# Patient Record
Sex: Female | Born: 1980
Health system: Southern US, Community
[De-identification: ages and names within clinical notes are randomized; demographics above are authoritative.]

## PROBLEM LIST (undated history)

## (undated) DIAGNOSIS — I071 Rheumatic tricuspid insufficiency: Secondary | ICD-10-CM

## (undated) DIAGNOSIS — E785 Hyperlipidemia, unspecified: Secondary | ICD-10-CM

## (undated) DIAGNOSIS — I471 Supraventricular tachycardia, unspecified: Secondary | ICD-10-CM

## (undated) DIAGNOSIS — F419 Anxiety disorder, unspecified: Secondary | ICD-10-CM

## (undated) DIAGNOSIS — K219 Gastro-esophageal reflux disease without esophagitis: Secondary | ICD-10-CM

## (undated) DIAGNOSIS — F32A Depression, unspecified: Secondary | ICD-10-CM

## (undated) DIAGNOSIS — F319 Bipolar disorder, unspecified: Secondary | ICD-10-CM

## (undated) DIAGNOSIS — E559 Vitamin D deficiency, unspecified: Secondary | ICD-10-CM

## (undated) DIAGNOSIS — I499 Cardiac arrhythmia, unspecified: Secondary | ICD-10-CM

## (undated) DIAGNOSIS — E0590 Thyrotoxicosis, unspecified without thyrotoxic crisis or storm: Secondary | ICD-10-CM

## (undated) DIAGNOSIS — D72829 Elevated white blood cell count, unspecified: Secondary | ICD-10-CM

## (undated) HISTORY — PX: KNEE ARTHROSCOPY: SUR90

## (undated) HISTORY — DX: Rheumatic tricuspid insufficiency: I07.1

## (undated) HISTORY — DX: Supraventricular tachycardia, unspecified: I47.10

## (undated) HISTORY — DX: Hyperlipidemia, unspecified: E78.5

## (undated) HISTORY — DX: Vitamin D deficiency, unspecified: E55.9

## (undated) HISTORY — PX: WRIST SURGERY: SHX841

## (undated) HISTORY — PX: ABDOMINAL HYSTERECTOMY: SHX81

## (undated) HISTORY — DX: Thyrotoxicosis, unspecified without thyrotoxic crisis or storm: E05.90

## (undated) HISTORY — DX: Elevated white blood cell count, unspecified: D72.829

---

## 2002-01-26 HISTORY — PX: ABLATION: SHX5711

## 2006-07-27 HISTORY — PX: ABDOMINAL HYSTERECTOMY: SHX81

## 2011-02-23 DIAGNOSIS — J019 Acute sinusitis, unspecified: Secondary | ICD-10-CM | POA: Diagnosis not present

## 2011-02-23 DIAGNOSIS — R350 Frequency of micturition: Secondary | ICD-10-CM | POA: Diagnosis not present

## 2011-03-30 DIAGNOSIS — J069 Acute upper respiratory infection, unspecified: Secondary | ICD-10-CM | POA: Diagnosis not present

## 2011-03-30 DIAGNOSIS — R509 Fever, unspecified: Secondary | ICD-10-CM | POA: Diagnosis not present

## 2011-03-30 DIAGNOSIS — E0590 Thyrotoxicosis, unspecified without thyrotoxic crisis or storm: Secondary | ICD-10-CM | POA: Diagnosis not present

## 2011-03-30 DIAGNOSIS — F17200 Nicotine dependence, unspecified, uncomplicated: Secondary | ICD-10-CM | POA: Diagnosis not present

## 2011-03-30 DIAGNOSIS — R05 Cough: Secondary | ICD-10-CM | POA: Diagnosis not present

## 2011-03-31 DIAGNOSIS — E0590 Thyrotoxicosis, unspecified without thyrotoxic crisis or storm: Secondary | ICD-10-CM | POA: Diagnosis not present

## 2011-03-31 DIAGNOSIS — K219 Gastro-esophageal reflux disease without esophagitis: Secondary | ICD-10-CM | POA: Diagnosis not present

## 2011-04-01 DIAGNOSIS — R7401 Elevation of levels of liver transaminase levels: Secondary | ICD-10-CM | POA: Diagnosis not present

## 2011-04-01 DIAGNOSIS — R1011 Right upper quadrant pain: Secondary | ICD-10-CM | POA: Diagnosis not present

## 2011-04-01 DIAGNOSIS — R1013 Epigastric pain: Secondary | ICD-10-CM | POA: Diagnosis not present

## 2011-04-01 DIAGNOSIS — R7989 Other specified abnormal findings of blood chemistry: Secondary | ICD-10-CM | POA: Diagnosis not present

## 2011-04-27 DIAGNOSIS — N760 Acute vaginitis: Secondary | ICD-10-CM | POA: Diagnosis not present

## 2011-04-27 DIAGNOSIS — F17200 Nicotine dependence, unspecified, uncomplicated: Secondary | ICD-10-CM | POA: Diagnosis not present

## 2011-04-27 DIAGNOSIS — A499 Bacterial infection, unspecified: Secondary | ICD-10-CM | POA: Diagnosis not present

## 2011-04-27 DIAGNOSIS — N949 Unspecified condition associated with female genital organs and menstrual cycle: Secondary | ICD-10-CM | POA: Diagnosis not present

## 2011-04-27 DIAGNOSIS — R109 Unspecified abdominal pain: Secondary | ICD-10-CM | POA: Diagnosis not present

## 2011-06-22 DIAGNOSIS — L02519 Cutaneous abscess of unspecified hand: Secondary | ICD-10-CM | POA: Diagnosis not present

## 2011-06-22 DIAGNOSIS — E0590 Thyrotoxicosis, unspecified without thyrotoxic crisis or storm: Secondary | ICD-10-CM | POA: Diagnosis not present

## 2011-06-22 DIAGNOSIS — E7800 Pure hypercholesterolemia, unspecified: Secondary | ICD-10-CM | POA: Diagnosis not present

## 2011-06-22 DIAGNOSIS — F17200 Nicotine dependence, unspecified, uncomplicated: Secondary | ICD-10-CM | POA: Diagnosis not present

## 2011-08-02 DIAGNOSIS — F17200 Nicotine dependence, unspecified, uncomplicated: Secondary | ICD-10-CM | POA: Diagnosis not present

## 2011-08-02 DIAGNOSIS — Z23 Encounter for immunization: Secondary | ICD-10-CM | POA: Diagnosis not present

## 2011-08-02 DIAGNOSIS — E0590 Thyrotoxicosis, unspecified without thyrotoxic crisis or storm: Secondary | ICD-10-CM | POA: Diagnosis not present

## 2011-08-02 DIAGNOSIS — T310 Burns involving less than 10% of body surface: Secondary | ICD-10-CM | POA: Diagnosis not present

## 2011-08-02 DIAGNOSIS — IMO0002 Reserved for concepts with insufficient information to code with codable children: Secondary | ICD-10-CM | POA: Diagnosis not present

## 2011-08-02 DIAGNOSIS — T23209A Burn of second degree of unspecified hand, unspecified site, initial encounter: Secondary | ICD-10-CM | POA: Diagnosis not present

## 2011-08-02 DIAGNOSIS — E7800 Pure hypercholesterolemia, unspecified: Secondary | ICD-10-CM | POA: Diagnosis not present

## 2011-09-08 DIAGNOSIS — N951 Menopausal and female climacteric states: Secondary | ICD-10-CM | POA: Diagnosis not present

## 2011-09-08 DIAGNOSIS — N760 Acute vaginitis: Secondary | ICD-10-CM | POA: Diagnosis not present

## 2011-09-08 DIAGNOSIS — E8941 Symptomatic postprocedural ovarian failure: Secondary | ICD-10-CM | POA: Diagnosis not present

## 2011-09-09 DIAGNOSIS — K029 Dental caries, unspecified: Secondary | ICD-10-CM | POA: Diagnosis not present

## 2011-09-09 DIAGNOSIS — E0590 Thyrotoxicosis, unspecified without thyrotoxic crisis or storm: Secondary | ICD-10-CM | POA: Diagnosis not present

## 2011-09-09 DIAGNOSIS — K047 Periapical abscess without sinus: Secondary | ICD-10-CM | POA: Diagnosis not present

## 2011-09-09 DIAGNOSIS — E7800 Pure hypercholesterolemia, unspecified: Secondary | ICD-10-CM | POA: Diagnosis not present

## 2011-09-09 DIAGNOSIS — F17200 Nicotine dependence, unspecified, uncomplicated: Secondary | ICD-10-CM | POA: Diagnosis not present

## 2011-09-16 DIAGNOSIS — Z113 Encounter for screening for infections with a predominantly sexual mode of transmission: Secondary | ICD-10-CM | POA: Diagnosis not present

## 2011-09-16 DIAGNOSIS — N760 Acute vaginitis: Secondary | ICD-10-CM | POA: Diagnosis not present

## 2011-10-27 DIAGNOSIS — F3162 Bipolar disorder, current episode mixed, moderate: Secondary | ICD-10-CM | POA: Diagnosis not present

## 2011-10-27 DIAGNOSIS — M549 Dorsalgia, unspecified: Secondary | ICD-10-CM | POA: Diagnosis not present

## 2011-10-27 DIAGNOSIS — R748 Abnormal levels of other serum enzymes: Secondary | ICD-10-CM | POA: Diagnosis not present

## 2011-10-27 DIAGNOSIS — M546 Pain in thoracic spine: Secondary | ICD-10-CM | POA: Diagnosis not present

## 2011-12-04 DIAGNOSIS — R509 Fever, unspecified: Secondary | ICD-10-CM | POA: Diagnosis not present

## 2011-12-04 DIAGNOSIS — R109 Unspecified abdominal pain: Secondary | ICD-10-CM | POA: Diagnosis not present

## 2011-12-04 DIAGNOSIS — R112 Nausea with vomiting, unspecified: Secondary | ICD-10-CM | POA: Diagnosis not present

## 2011-12-04 DIAGNOSIS — K5289 Other specified noninfective gastroenteritis and colitis: Secondary | ICD-10-CM | POA: Diagnosis not present

## 2012-01-26 DIAGNOSIS — E0590 Thyrotoxicosis, unspecified without thyrotoxic crisis or storm: Secondary | ICD-10-CM | POA: Diagnosis not present

## 2012-01-26 DIAGNOSIS — E039 Hypothyroidism, unspecified: Secondary | ICD-10-CM | POA: Diagnosis not present

## 2012-01-26 DIAGNOSIS — E782 Mixed hyperlipidemia: Secondary | ICD-10-CM | POA: Diagnosis not present

## 2012-01-26 DIAGNOSIS — E119 Type 2 diabetes mellitus without complications: Secondary | ICD-10-CM | POA: Diagnosis not present

## 2012-01-26 DIAGNOSIS — F341 Dysthymic disorder: Secondary | ICD-10-CM | POA: Diagnosis not present

## 2012-01-26 DIAGNOSIS — I10 Essential (primary) hypertension: Secondary | ICD-10-CM | POA: Diagnosis not present

## 2012-01-26 DIAGNOSIS — F3162 Bipolar disorder, current episode mixed, moderate: Secondary | ICD-10-CM | POA: Diagnosis not present

## 2012-01-28 DIAGNOSIS — E7800 Pure hypercholesterolemia, unspecified: Secondary | ICD-10-CM | POA: Diagnosis not present

## 2012-01-28 DIAGNOSIS — R109 Unspecified abdominal pain: Secondary | ICD-10-CM | POA: Diagnosis not present

## 2012-01-28 DIAGNOSIS — F17200 Nicotine dependence, unspecified, uncomplicated: Secondary | ICD-10-CM | POA: Diagnosis not present

## 2012-01-28 DIAGNOSIS — E0590 Thyrotoxicosis, unspecified without thyrotoxic crisis or storm: Secondary | ICD-10-CM | POA: Diagnosis not present

## 2012-03-08 DIAGNOSIS — F17200 Nicotine dependence, unspecified, uncomplicated: Secondary | ICD-10-CM | POA: Diagnosis not present

## 2012-03-08 DIAGNOSIS — J209 Acute bronchitis, unspecified: Secondary | ICD-10-CM | POA: Diagnosis not present

## 2012-03-08 DIAGNOSIS — F3160 Bipolar disorder, current episode mixed, unspecified: Secondary | ICD-10-CM | POA: Diagnosis not present

## 2012-03-24 DIAGNOSIS — F319 Bipolar disorder, unspecified: Secondary | ICD-10-CM | POA: Diagnosis not present

## 2012-04-07 DIAGNOSIS — F3160 Bipolar disorder, current episode mixed, unspecified: Secondary | ICD-10-CM | POA: Diagnosis not present

## 2012-05-09 DIAGNOSIS — F3160 Bipolar disorder, current episode mixed, unspecified: Secondary | ICD-10-CM | POA: Diagnosis not present

## 2012-08-03 DIAGNOSIS — E782 Mixed hyperlipidemia: Secondary | ICD-10-CM | POA: Diagnosis not present

## 2012-08-03 DIAGNOSIS — E0590 Thyrotoxicosis, unspecified without thyrotoxic crisis or storm: Secondary | ICD-10-CM | POA: Diagnosis not present

## 2012-08-04 DIAGNOSIS — M79609 Pain in unspecified limb: Secondary | ICD-10-CM | POA: Diagnosis not present

## 2012-08-04 DIAGNOSIS — F17200 Nicotine dependence, unspecified, uncomplicated: Secondary | ICD-10-CM | POA: Diagnosis not present

## 2012-08-04 DIAGNOSIS — M25549 Pain in joints of unspecified hand: Secondary | ICD-10-CM | POA: Diagnosis not present

## 2012-08-04 DIAGNOSIS — J45909 Unspecified asthma, uncomplicated: Secondary | ICD-10-CM | POA: Diagnosis not present

## 2012-08-04 DIAGNOSIS — E7800 Pure hypercholesterolemia, unspecified: Secondary | ICD-10-CM | POA: Diagnosis not present

## 2012-08-04 DIAGNOSIS — E0590 Thyrotoxicosis, unspecified without thyrotoxic crisis or storm: Secondary | ICD-10-CM | POA: Diagnosis not present

## 2012-08-10 DIAGNOSIS — F3160 Bipolar disorder, current episode mixed, unspecified: Secondary | ICD-10-CM | POA: Diagnosis not present

## 2012-08-10 DIAGNOSIS — F17200 Nicotine dependence, unspecified, uncomplicated: Secondary | ICD-10-CM | POA: Diagnosis not present

## 2012-09-19 DIAGNOSIS — T7840XA Allergy, unspecified, initial encounter: Secondary | ICD-10-CM | POA: Diagnosis not present

## 2012-09-19 DIAGNOSIS — H5210 Myopia, unspecified eye: Secondary | ICD-10-CM | POA: Diagnosis not present

## 2012-09-19 DIAGNOSIS — H52229 Regular astigmatism, unspecified eye: Secondary | ICD-10-CM | POA: Diagnosis not present

## 2012-10-03 DIAGNOSIS — H1044 Vernal conjunctivitis: Secondary | ICD-10-CM | POA: Diagnosis not present

## 2012-11-08 DIAGNOSIS — Z1389 Encounter for screening for other disorder: Secondary | ICD-10-CM | POA: Diagnosis not present

## 2012-11-08 DIAGNOSIS — F17200 Nicotine dependence, unspecified, uncomplicated: Secondary | ICD-10-CM | POA: Diagnosis not present

## 2012-11-08 DIAGNOSIS — IMO0002 Reserved for concepts with insufficient information to code with codable children: Secondary | ICD-10-CM | POA: Diagnosis not present

## 2012-11-08 DIAGNOSIS — J029 Acute pharyngitis, unspecified: Secondary | ICD-10-CM | POA: Diagnosis not present

## 2012-11-22 DIAGNOSIS — F17200 Nicotine dependence, unspecified, uncomplicated: Secondary | ICD-10-CM | POA: Diagnosis not present

## 2012-11-22 DIAGNOSIS — Z1389 Encounter for screening for other disorder: Secondary | ICD-10-CM | POA: Diagnosis not present

## 2012-11-22 DIAGNOSIS — Z139 Encounter for screening, unspecified: Secondary | ICD-10-CM | POA: Diagnosis not present

## 2012-11-22 DIAGNOSIS — Z0000 Encounter for general adult medical examination without abnormal findings: Secondary | ICD-10-CM | POA: Diagnosis not present

## 2012-11-22 DIAGNOSIS — Z1331 Encounter for screening for depression: Secondary | ICD-10-CM | POA: Diagnosis not present

## 2012-11-22 DIAGNOSIS — IMO0002 Reserved for concepts with insufficient information to code with codable children: Secondary | ICD-10-CM | POA: Diagnosis not present

## 2012-11-29 DIAGNOSIS — F17200 Nicotine dependence, unspecified, uncomplicated: Secondary | ICD-10-CM | POA: Diagnosis not present

## 2012-11-29 DIAGNOSIS — J0110 Acute frontal sinusitis, unspecified: Secondary | ICD-10-CM | POA: Diagnosis not present

## 2012-12-06 DIAGNOSIS — Z23 Encounter for immunization: Secondary | ICD-10-CM | POA: Diagnosis not present

## 2012-12-06 DIAGNOSIS — IMO0002 Reserved for concepts with insufficient information to code with codable children: Secondary | ICD-10-CM | POA: Diagnosis not present

## 2012-12-06 DIAGNOSIS — Z1389 Encounter for screening for other disorder: Secondary | ICD-10-CM | POA: Diagnosis not present

## 2012-12-06 DIAGNOSIS — F17200 Nicotine dependence, unspecified, uncomplicated: Secondary | ICD-10-CM | POA: Diagnosis not present

## 2012-12-15 DIAGNOSIS — E782 Mixed hyperlipidemia: Secondary | ICD-10-CM | POA: Diagnosis not present

## 2012-12-15 DIAGNOSIS — E0590 Thyrotoxicosis, unspecified without thyrotoxic crisis or storm: Secondary | ICD-10-CM | POA: Diagnosis not present

## 2012-12-27 DIAGNOSIS — IMO0002 Reserved for concepts with insufficient information to code with codable children: Secondary | ICD-10-CM | POA: Diagnosis not present

## 2012-12-27 DIAGNOSIS — F17200 Nicotine dependence, unspecified, uncomplicated: Secondary | ICD-10-CM | POA: Diagnosis not present

## 2012-12-27 DIAGNOSIS — Z1389 Encounter for screening for other disorder: Secondary | ICD-10-CM | POA: Diagnosis not present

## 2013-05-01 DIAGNOSIS — IMO0002 Reserved for concepts with insufficient information to code with codable children: Secondary | ICD-10-CM | POA: Diagnosis not present

## 2013-05-01 DIAGNOSIS — E7800 Pure hypercholesterolemia, unspecified: Secondary | ICD-10-CM | POA: Diagnosis not present

## 2013-05-01 DIAGNOSIS — Z79899 Other long term (current) drug therapy: Secondary | ICD-10-CM | POA: Diagnosis not present

## 2013-05-01 DIAGNOSIS — E0590 Thyrotoxicosis, unspecified without thyrotoxic crisis or storm: Secondary | ICD-10-CM | POA: Diagnosis not present

## 2013-10-23 DIAGNOSIS — F17200 Nicotine dependence, unspecified, uncomplicated: Secondary | ICD-10-CM | POA: Diagnosis not present

## 2013-10-23 DIAGNOSIS — Z79899 Other long term (current) drug therapy: Secondary | ICD-10-CM | POA: Diagnosis not present

## 2013-10-23 DIAGNOSIS — E7800 Pure hypercholesterolemia, unspecified: Secondary | ICD-10-CM | POA: Diagnosis not present

## 2013-10-23 DIAGNOSIS — E0590 Thyrotoxicosis, unspecified without thyrotoxic crisis or storm: Secondary | ICD-10-CM | POA: Diagnosis not present

## 2013-10-23 DIAGNOSIS — M25519 Pain in unspecified shoulder: Secondary | ICD-10-CM | POA: Diagnosis not present

## 2013-10-23 DIAGNOSIS — J45909 Unspecified asthma, uncomplicated: Secondary | ICD-10-CM | POA: Diagnosis not present

## 2013-10-31 DIAGNOSIS — Z681 Body mass index (BMI) 19 or less, adult: Secondary | ICD-10-CM | POA: Diagnosis not present

## 2013-10-31 DIAGNOSIS — R05 Cough: Secondary | ICD-10-CM | POA: Diagnosis not present

## 2013-11-02 DIAGNOSIS — S46811A Strain of other muscles, fascia and tendons at shoulder and upper arm level, right arm, initial encounter: Secondary | ICD-10-CM | POA: Diagnosis not present

## 2013-11-02 DIAGNOSIS — F3160 Bipolar disorder, current episode mixed, unspecified: Secondary | ICD-10-CM | POA: Diagnosis not present

## 2013-11-02 DIAGNOSIS — E0590 Thyrotoxicosis, unspecified without thyrotoxic crisis or storm: Secondary | ICD-10-CM | POA: Diagnosis not present

## 2013-11-02 DIAGNOSIS — Z6820 Body mass index (BMI) 20.0-20.9, adult: Secondary | ICD-10-CM | POA: Diagnosis not present

## 2013-11-29 DIAGNOSIS — J189 Pneumonia, unspecified organism: Secondary | ICD-10-CM | POA: Diagnosis not present

## 2013-11-29 DIAGNOSIS — R062 Wheezing: Secondary | ICD-10-CM | POA: Diagnosis not present

## 2013-12-11 DIAGNOSIS — G44209 Tension-type headache, unspecified, not intractable: Secondary | ICD-10-CM | POA: Diagnosis not present

## 2013-12-11 DIAGNOSIS — M2660 Temporomandibular joint disorder, unspecified: Secondary | ICD-10-CM | POA: Diagnosis not present

## 2013-12-14 DIAGNOSIS — Z23 Encounter for immunization: Secondary | ICD-10-CM | POA: Diagnosis not present

## 2013-12-14 DIAGNOSIS — Z131 Encounter for screening for diabetes mellitus: Secondary | ICD-10-CM | POA: Diagnosis not present

## 2013-12-14 DIAGNOSIS — Z1239 Encounter for other screening for malignant neoplasm of breast: Secondary | ICD-10-CM | POA: Diagnosis not present

## 2013-12-14 DIAGNOSIS — E782 Mixed hyperlipidemia: Secondary | ICD-10-CM | POA: Diagnosis not present

## 2013-12-26 DIAGNOSIS — Z1231 Encounter for screening mammogram for malignant neoplasm of breast: Secondary | ICD-10-CM | POA: Diagnosis not present

## 2014-01-11 DIAGNOSIS — R920 Mammographic microcalcification found on diagnostic imaging of breast: Secondary | ICD-10-CM | POA: Diagnosis not present

## 2014-01-11 DIAGNOSIS — N63 Unspecified lump in breast: Secondary | ICD-10-CM | POA: Diagnosis not present

## 2014-01-11 DIAGNOSIS — R928 Other abnormal and inconclusive findings on diagnostic imaging of breast: Secondary | ICD-10-CM | POA: Diagnosis not present

## 2014-04-09 DIAGNOSIS — E039 Hypothyroidism, unspecified: Secondary | ICD-10-CM | POA: Diagnosis not present

## 2014-04-09 DIAGNOSIS — E162 Hypoglycemia, unspecified: Secondary | ICD-10-CM | POA: Diagnosis not present

## 2014-04-09 DIAGNOSIS — Z6822 Body mass index (BMI) 22.0-22.9, adult: Secondary | ICD-10-CM | POA: Diagnosis not present

## 2014-04-09 DIAGNOSIS — R42 Dizziness and giddiness: Secondary | ICD-10-CM | POA: Diagnosis not present

## 2014-04-26 DIAGNOSIS — Z6823 Body mass index (BMI) 23.0-23.9, adult: Secondary | ICD-10-CM | POA: Diagnosis not present

## 2014-04-26 DIAGNOSIS — E162 Hypoglycemia, unspecified: Secondary | ICD-10-CM | POA: Diagnosis not present

## 2014-06-11 DIAGNOSIS — Z6823 Body mass index (BMI) 23.0-23.9, adult: Secondary | ICD-10-CM | POA: Diagnosis not present

## 2014-06-11 DIAGNOSIS — M545 Low back pain: Secondary | ICD-10-CM | POA: Diagnosis not present

## 2014-06-11 DIAGNOSIS — G8929 Other chronic pain: Secondary | ICD-10-CM | POA: Diagnosis not present

## 2014-08-13 DIAGNOSIS — M19011 Primary osteoarthritis, right shoulder: Secondary | ICD-10-CM | POA: Diagnosis not present

## 2014-08-13 DIAGNOSIS — Z6822 Body mass index (BMI) 22.0-22.9, adult: Secondary | ICD-10-CM | POA: Diagnosis not present

## 2014-08-13 DIAGNOSIS — M19012 Primary osteoarthritis, left shoulder: Secondary | ICD-10-CM | POA: Diagnosis not present

## 2014-08-13 DIAGNOSIS — M25512 Pain in left shoulder: Secondary | ICD-10-CM | POA: Diagnosis not present

## 2014-08-24 DIAGNOSIS — M799 Soft tissue disorder, unspecified: Secondary | ICD-10-CM | POA: Diagnosis not present

## 2014-08-24 DIAGNOSIS — M25512 Pain in left shoulder: Secondary | ICD-10-CM | POA: Diagnosis not present

## 2014-08-24 DIAGNOSIS — M7542 Impingement syndrome of left shoulder: Secondary | ICD-10-CM | POA: Diagnosis not present

## 2014-08-24 DIAGNOSIS — M25612 Stiffness of left shoulder, not elsewhere classified: Secondary | ICD-10-CM | POA: Diagnosis not present

## 2014-08-27 DIAGNOSIS — M799 Soft tissue disorder, unspecified: Secondary | ICD-10-CM | POA: Diagnosis not present

## 2014-08-27 DIAGNOSIS — M25512 Pain in left shoulder: Secondary | ICD-10-CM | POA: Diagnosis not present

## 2014-08-27 DIAGNOSIS — M7542 Impingement syndrome of left shoulder: Secondary | ICD-10-CM | POA: Diagnosis not present

## 2014-08-27 DIAGNOSIS — M25612 Stiffness of left shoulder, not elsewhere classified: Secondary | ICD-10-CM | POA: Diagnosis not present

## 2014-08-29 DIAGNOSIS — M7542 Impingement syndrome of left shoulder: Secondary | ICD-10-CM | POA: Diagnosis not present

## 2014-08-29 DIAGNOSIS — M25612 Stiffness of left shoulder, not elsewhere classified: Secondary | ICD-10-CM | POA: Diagnosis not present

## 2014-08-29 DIAGNOSIS — M799 Soft tissue disorder, unspecified: Secondary | ICD-10-CM | POA: Diagnosis not present

## 2014-08-29 DIAGNOSIS — M25512 Pain in left shoulder: Secondary | ICD-10-CM | POA: Diagnosis not present

## 2014-08-30 DIAGNOSIS — M25512 Pain in left shoulder: Secondary | ICD-10-CM | POA: Diagnosis not present

## 2014-08-30 DIAGNOSIS — M25612 Stiffness of left shoulder, not elsewhere classified: Secondary | ICD-10-CM | POA: Diagnosis not present

## 2014-08-30 DIAGNOSIS — M7542 Impingement syndrome of left shoulder: Secondary | ICD-10-CM | POA: Diagnosis not present

## 2014-08-30 DIAGNOSIS — M799 Soft tissue disorder, unspecified: Secondary | ICD-10-CM | POA: Diagnosis not present

## 2014-09-04 DIAGNOSIS — M799 Soft tissue disorder, unspecified: Secondary | ICD-10-CM | POA: Diagnosis not present

## 2014-09-04 DIAGNOSIS — M7542 Impingement syndrome of left shoulder: Secondary | ICD-10-CM | POA: Diagnosis not present

## 2014-09-04 DIAGNOSIS — M25612 Stiffness of left shoulder, not elsewhere classified: Secondary | ICD-10-CM | POA: Diagnosis not present

## 2014-09-04 DIAGNOSIS — M25512 Pain in left shoulder: Secondary | ICD-10-CM | POA: Diagnosis not present

## 2014-09-05 DIAGNOSIS — M25612 Stiffness of left shoulder, not elsewhere classified: Secondary | ICD-10-CM | POA: Diagnosis not present

## 2014-09-05 DIAGNOSIS — M7542 Impingement syndrome of left shoulder: Secondary | ICD-10-CM | POA: Diagnosis not present

## 2014-09-05 DIAGNOSIS — M25512 Pain in left shoulder: Secondary | ICD-10-CM | POA: Diagnosis not present

## 2014-09-05 DIAGNOSIS — M799 Soft tissue disorder, unspecified: Secondary | ICD-10-CM | POA: Diagnosis not present

## 2014-09-06 DIAGNOSIS — M799 Soft tissue disorder, unspecified: Secondary | ICD-10-CM | POA: Diagnosis not present

## 2014-09-06 DIAGNOSIS — M25512 Pain in left shoulder: Secondary | ICD-10-CM | POA: Diagnosis not present

## 2014-09-06 DIAGNOSIS — M25612 Stiffness of left shoulder, not elsewhere classified: Secondary | ICD-10-CM | POA: Diagnosis not present

## 2014-09-06 DIAGNOSIS — M7542 Impingement syndrome of left shoulder: Secondary | ICD-10-CM | POA: Diagnosis not present

## 2014-09-10 DIAGNOSIS — M25512 Pain in left shoulder: Secondary | ICD-10-CM | POA: Diagnosis not present

## 2014-09-10 DIAGNOSIS — M799 Soft tissue disorder, unspecified: Secondary | ICD-10-CM | POA: Diagnosis not present

## 2014-09-10 DIAGNOSIS — M7542 Impingement syndrome of left shoulder: Secondary | ICD-10-CM | POA: Diagnosis not present

## 2014-09-10 DIAGNOSIS — M25612 Stiffness of left shoulder, not elsewhere classified: Secondary | ICD-10-CM | POA: Diagnosis not present

## 2014-09-12 DIAGNOSIS — M25512 Pain in left shoulder: Secondary | ICD-10-CM | POA: Diagnosis not present

## 2014-09-12 DIAGNOSIS — M7542 Impingement syndrome of left shoulder: Secondary | ICD-10-CM | POA: Diagnosis not present

## 2014-09-12 DIAGNOSIS — M799 Soft tissue disorder, unspecified: Secondary | ICD-10-CM | POA: Diagnosis not present

## 2014-09-12 DIAGNOSIS — M25612 Stiffness of left shoulder, not elsewhere classified: Secondary | ICD-10-CM | POA: Diagnosis not present

## 2014-09-14 DIAGNOSIS — M799 Soft tissue disorder, unspecified: Secondary | ICD-10-CM | POA: Diagnosis not present

## 2014-09-14 DIAGNOSIS — M7542 Impingement syndrome of left shoulder: Secondary | ICD-10-CM | POA: Diagnosis not present

## 2014-09-14 DIAGNOSIS — M25512 Pain in left shoulder: Secondary | ICD-10-CM | POA: Diagnosis not present

## 2014-09-14 DIAGNOSIS — M25612 Stiffness of left shoulder, not elsewhere classified: Secondary | ICD-10-CM | POA: Diagnosis not present

## 2014-09-19 DIAGNOSIS — M799 Soft tissue disorder, unspecified: Secondary | ICD-10-CM | POA: Diagnosis not present

## 2014-09-19 DIAGNOSIS — M25512 Pain in left shoulder: Secondary | ICD-10-CM | POA: Diagnosis not present

## 2014-09-19 DIAGNOSIS — M25612 Stiffness of left shoulder, not elsewhere classified: Secondary | ICD-10-CM | POA: Diagnosis not present

## 2014-09-19 DIAGNOSIS — M7542 Impingement syndrome of left shoulder: Secondary | ICD-10-CM | POA: Diagnosis not present

## 2014-09-24 DIAGNOSIS — M25612 Stiffness of left shoulder, not elsewhere classified: Secondary | ICD-10-CM | POA: Diagnosis not present

## 2014-09-24 DIAGNOSIS — M25512 Pain in left shoulder: Secondary | ICD-10-CM | POA: Diagnosis not present

## 2014-09-24 DIAGNOSIS — M799 Soft tissue disorder, unspecified: Secondary | ICD-10-CM | POA: Diagnosis not present

## 2014-09-24 DIAGNOSIS — M7542 Impingement syndrome of left shoulder: Secondary | ICD-10-CM | POA: Diagnosis not present

## 2014-09-27 DIAGNOSIS — M7542 Impingement syndrome of left shoulder: Secondary | ICD-10-CM | POA: Diagnosis not present

## 2014-09-27 DIAGNOSIS — M25612 Stiffness of left shoulder, not elsewhere classified: Secondary | ICD-10-CM | POA: Diagnosis not present

## 2014-09-27 DIAGNOSIS — M25512 Pain in left shoulder: Secondary | ICD-10-CM | POA: Diagnosis not present

## 2014-09-27 DIAGNOSIS — M799 Soft tissue disorder, unspecified: Secondary | ICD-10-CM | POA: Diagnosis not present

## 2014-10-03 DIAGNOSIS — M799 Soft tissue disorder, unspecified: Secondary | ICD-10-CM | POA: Diagnosis not present

## 2014-10-03 DIAGNOSIS — M25512 Pain in left shoulder: Secondary | ICD-10-CM | POA: Diagnosis not present

## 2014-10-03 DIAGNOSIS — M25612 Stiffness of left shoulder, not elsewhere classified: Secondary | ICD-10-CM | POA: Diagnosis not present

## 2014-10-03 DIAGNOSIS — M7542 Impingement syndrome of left shoulder: Secondary | ICD-10-CM | POA: Diagnosis not present

## 2014-11-30 DIAGNOSIS — Z23 Encounter for immunization: Secondary | ICD-10-CM | POA: Diagnosis not present

## 2014-11-30 DIAGNOSIS — Z6823 Body mass index (BMI) 23.0-23.9, adult: Secondary | ICD-10-CM | POA: Diagnosis not present

## 2014-11-30 DIAGNOSIS — M545 Low back pain: Secondary | ICD-10-CM | POA: Diagnosis not present

## 2014-12-07 DIAGNOSIS — M799 Soft tissue disorder, unspecified: Secondary | ICD-10-CM | POA: Diagnosis not present

## 2014-12-07 DIAGNOSIS — M545 Low back pain: Secondary | ICD-10-CM | POA: Diagnosis not present

## 2014-12-07 DIAGNOSIS — M6281 Muscle weakness (generalized): Secondary | ICD-10-CM | POA: Diagnosis not present

## 2014-12-07 DIAGNOSIS — M62830 Muscle spasm of back: Secondary | ICD-10-CM | POA: Diagnosis not present

## 2014-12-11 DIAGNOSIS — M62830 Muscle spasm of back: Secondary | ICD-10-CM | POA: Diagnosis not present

## 2014-12-11 DIAGNOSIS — M6281 Muscle weakness (generalized): Secondary | ICD-10-CM | POA: Diagnosis not present

## 2014-12-11 DIAGNOSIS — J029 Acute pharyngitis, unspecified: Secondary | ICD-10-CM | POA: Diagnosis not present

## 2014-12-11 DIAGNOSIS — M799 Soft tissue disorder, unspecified: Secondary | ICD-10-CM | POA: Diagnosis not present

## 2014-12-11 DIAGNOSIS — M545 Low back pain: Secondary | ICD-10-CM | POA: Diagnosis not present

## 2014-12-11 DIAGNOSIS — R52 Pain, unspecified: Secondary | ICD-10-CM | POA: Diagnosis not present

## 2014-12-11 DIAGNOSIS — Z6823 Body mass index (BMI) 23.0-23.9, adult: Secondary | ICD-10-CM | POA: Diagnosis not present

## 2014-12-11 DIAGNOSIS — R05 Cough: Secondary | ICD-10-CM | POA: Diagnosis not present

## 2014-12-14 DIAGNOSIS — M799 Soft tissue disorder, unspecified: Secondary | ICD-10-CM | POA: Diagnosis not present

## 2014-12-14 DIAGNOSIS — M6281 Muscle weakness (generalized): Secondary | ICD-10-CM | POA: Diagnosis not present

## 2014-12-14 DIAGNOSIS — M545 Low back pain: Secondary | ICD-10-CM | POA: Diagnosis not present

## 2014-12-14 DIAGNOSIS — M62830 Muscle spasm of back: Secondary | ICD-10-CM | POA: Diagnosis not present

## 2014-12-17 DIAGNOSIS — M6281 Muscle weakness (generalized): Secondary | ICD-10-CM | POA: Diagnosis not present

## 2014-12-17 DIAGNOSIS — M62830 Muscle spasm of back: Secondary | ICD-10-CM | POA: Diagnosis not present

## 2014-12-17 DIAGNOSIS — M799 Soft tissue disorder, unspecified: Secondary | ICD-10-CM | POA: Diagnosis not present

## 2014-12-17 DIAGNOSIS — M545 Low back pain: Secondary | ICD-10-CM | POA: Diagnosis not present

## 2014-12-25 DIAGNOSIS — M6281 Muscle weakness (generalized): Secondary | ICD-10-CM | POA: Diagnosis not present

## 2014-12-25 DIAGNOSIS — M545 Low back pain: Secondary | ICD-10-CM | POA: Diagnosis not present

## 2014-12-25 DIAGNOSIS — M799 Soft tissue disorder, unspecified: Secondary | ICD-10-CM | POA: Diagnosis not present

## 2014-12-25 DIAGNOSIS — M62830 Muscle spasm of back: Secondary | ICD-10-CM | POA: Diagnosis not present

## 2014-12-26 DIAGNOSIS — M6281 Muscle weakness (generalized): Secondary | ICD-10-CM | POA: Diagnosis not present

## 2014-12-26 DIAGNOSIS — M62830 Muscle spasm of back: Secondary | ICD-10-CM | POA: Diagnosis not present

## 2014-12-26 DIAGNOSIS — M799 Soft tissue disorder, unspecified: Secondary | ICD-10-CM | POA: Diagnosis not present

## 2014-12-26 DIAGNOSIS — M545 Low back pain: Secondary | ICD-10-CM | POA: Diagnosis not present

## 2014-12-27 DIAGNOSIS — M62830 Muscle spasm of back: Secondary | ICD-10-CM | POA: Diagnosis not present

## 2014-12-27 DIAGNOSIS — F17200 Nicotine dependence, unspecified, uncomplicated: Secondary | ICD-10-CM | POA: Diagnosis not present

## 2014-12-27 DIAGNOSIS — M545 Low back pain: Secondary | ICD-10-CM | POA: Diagnosis not present

## 2014-12-27 DIAGNOSIS — Z0000 Encounter for general adult medical examination without abnormal findings: Secondary | ICD-10-CM | POA: Diagnosis not present

## 2014-12-27 DIAGNOSIS — M6281 Muscle weakness (generalized): Secondary | ICD-10-CM | POA: Diagnosis not present

## 2014-12-27 DIAGNOSIS — Z6822 Body mass index (BMI) 22.0-22.9, adult: Secondary | ICD-10-CM | POA: Diagnosis not present

## 2014-12-27 DIAGNOSIS — M799 Soft tissue disorder, unspecified: Secondary | ICD-10-CM | POA: Diagnosis not present

## 2014-12-27 DIAGNOSIS — Z209 Contact with and (suspected) exposure to unspecified communicable disease: Secondary | ICD-10-CM | POA: Diagnosis not present

## 2014-12-31 DIAGNOSIS — M6281 Muscle weakness (generalized): Secondary | ICD-10-CM | POA: Diagnosis not present

## 2014-12-31 DIAGNOSIS — M62830 Muscle spasm of back: Secondary | ICD-10-CM | POA: Diagnosis not present

## 2014-12-31 DIAGNOSIS — M799 Soft tissue disorder, unspecified: Secondary | ICD-10-CM | POA: Diagnosis not present

## 2014-12-31 DIAGNOSIS — M545 Low back pain: Secondary | ICD-10-CM | POA: Diagnosis not present

## 2015-01-01 DIAGNOSIS — M62830 Muscle spasm of back: Secondary | ICD-10-CM | POA: Diagnosis not present

## 2015-01-01 DIAGNOSIS — M545 Low back pain: Secondary | ICD-10-CM | POA: Diagnosis not present

## 2015-01-01 DIAGNOSIS — M6281 Muscle weakness (generalized): Secondary | ICD-10-CM | POA: Diagnosis not present

## 2015-01-01 DIAGNOSIS — M799 Soft tissue disorder, unspecified: Secondary | ICD-10-CM | POA: Diagnosis not present

## 2015-01-02 DIAGNOSIS — M62830 Muscle spasm of back: Secondary | ICD-10-CM | POA: Diagnosis not present

## 2015-01-02 DIAGNOSIS — M545 Low back pain: Secondary | ICD-10-CM | POA: Diagnosis not present

## 2015-01-02 DIAGNOSIS — M799 Soft tissue disorder, unspecified: Secondary | ICD-10-CM | POA: Diagnosis not present

## 2015-01-02 DIAGNOSIS — M6281 Muscle weakness (generalized): Secondary | ICD-10-CM | POA: Diagnosis not present

## 2015-01-07 DIAGNOSIS — Z6822 Body mass index (BMI) 22.0-22.9, adult: Secondary | ICD-10-CM | POA: Diagnosis not present

## 2015-01-07 DIAGNOSIS — R768 Other specified abnormal immunological findings in serum: Secondary | ICD-10-CM | POA: Diagnosis not present

## 2015-01-07 DIAGNOSIS — F3160 Bipolar disorder, current episode mixed, unspecified: Secondary | ICD-10-CM | POA: Diagnosis not present

## 2015-01-07 DIAGNOSIS — F17200 Nicotine dependence, unspecified, uncomplicated: Secondary | ICD-10-CM | POA: Diagnosis not present

## 2015-01-09 DIAGNOSIS — M545 Low back pain: Secondary | ICD-10-CM | POA: Diagnosis not present

## 2015-01-09 DIAGNOSIS — M62830 Muscle spasm of back: Secondary | ICD-10-CM | POA: Diagnosis not present

## 2015-01-09 DIAGNOSIS — M6281 Muscle weakness (generalized): Secondary | ICD-10-CM | POA: Diagnosis not present

## 2015-01-09 DIAGNOSIS — M799 Soft tissue disorder, unspecified: Secondary | ICD-10-CM | POA: Diagnosis not present

## 2015-01-17 DIAGNOSIS — M545 Low back pain: Secondary | ICD-10-CM | POA: Diagnosis not present

## 2015-01-17 DIAGNOSIS — M6281 Muscle weakness (generalized): Secondary | ICD-10-CM | POA: Diagnosis not present

## 2015-01-17 DIAGNOSIS — M62830 Muscle spasm of back: Secondary | ICD-10-CM | POA: Diagnosis not present

## 2015-01-17 DIAGNOSIS — M799 Soft tissue disorder, unspecified: Secondary | ICD-10-CM | POA: Diagnosis not present

## 2015-01-22 DIAGNOSIS — M545 Low back pain: Secondary | ICD-10-CM | POA: Diagnosis not present

## 2015-01-22 DIAGNOSIS — M6281 Muscle weakness (generalized): Secondary | ICD-10-CM | POA: Diagnosis not present

## 2015-01-22 DIAGNOSIS — M62830 Muscle spasm of back: Secondary | ICD-10-CM | POA: Diagnosis not present

## 2015-01-22 DIAGNOSIS — M799 Soft tissue disorder, unspecified: Secondary | ICD-10-CM | POA: Diagnosis not present

## 2015-01-24 DIAGNOSIS — M545 Low back pain: Secondary | ICD-10-CM | POA: Diagnosis not present

## 2015-01-24 DIAGNOSIS — M799 Soft tissue disorder, unspecified: Secondary | ICD-10-CM | POA: Diagnosis not present

## 2015-01-24 DIAGNOSIS — M62830 Muscle spasm of back: Secondary | ICD-10-CM | POA: Diagnosis not present

## 2015-01-24 DIAGNOSIS — M6281 Muscle weakness (generalized): Secondary | ICD-10-CM | POA: Diagnosis not present

## 2015-02-14 DIAGNOSIS — Z6822 Body mass index (BMI) 22.0-22.9, adult: Secondary | ICD-10-CM | POA: Diagnosis not present

## 2015-02-14 DIAGNOSIS — F418 Other specified anxiety disorders: Secondary | ICD-10-CM | POA: Diagnosis not present

## 2015-05-15 DIAGNOSIS — J029 Acute pharyngitis, unspecified: Secondary | ICD-10-CM | POA: Diagnosis not present

## 2015-05-15 DIAGNOSIS — Z20828 Contact with and (suspected) exposure to other viral communicable diseases: Secondary | ICD-10-CM | POA: Diagnosis not present

## 2015-05-15 DIAGNOSIS — R05 Cough: Secondary | ICD-10-CM | POA: Diagnosis not present

## 2015-05-15 DIAGNOSIS — Z1389 Encounter for screening for other disorder: Secondary | ICD-10-CM | POA: Diagnosis not present

## 2015-05-15 DIAGNOSIS — J302 Other seasonal allergic rhinitis: Secondary | ICD-10-CM | POA: Diagnosis not present

## 2015-09-10 DIAGNOSIS — F418 Other specified anxiety disorders: Secondary | ICD-10-CM | POA: Diagnosis not present

## 2015-09-10 DIAGNOSIS — N760 Acute vaginitis: Secondary | ICD-10-CM | POA: Diagnosis not present

## 2015-09-10 DIAGNOSIS — R300 Dysuria: Secondary | ICD-10-CM | POA: Diagnosis not present

## 2015-09-10 DIAGNOSIS — B9689 Other specified bacterial agents as the cause of diseases classified elsewhere: Secondary | ICD-10-CM | POA: Diagnosis not present

## 2015-10-31 DIAGNOSIS — F3160 Bipolar disorder, current episode mixed, unspecified: Secondary | ICD-10-CM | POA: Diagnosis not present

## 2015-10-31 DIAGNOSIS — Z6822 Body mass index (BMI) 22.0-22.9, adult: Secondary | ICD-10-CM | POA: Diagnosis not present

## 2015-11-05 DIAGNOSIS — F17200 Nicotine dependence, unspecified, uncomplicated: Secondary | ICD-10-CM | POA: Diagnosis not present

## 2015-11-05 DIAGNOSIS — J029 Acute pharyngitis, unspecified: Secondary | ICD-10-CM | POA: Diagnosis not present

## 2015-11-05 DIAGNOSIS — R05 Cough: Secondary | ICD-10-CM | POA: Diagnosis not present

## 2015-11-05 DIAGNOSIS — J209 Acute bronchitis, unspecified: Secondary | ICD-10-CM | POA: Diagnosis not present

## 2015-12-12 DIAGNOSIS — Z1389 Encounter for screening for other disorder: Secondary | ICD-10-CM | POA: Diagnosis not present

## 2015-12-12 DIAGNOSIS — Z139 Encounter for screening, unspecified: Secondary | ICD-10-CM | POA: Diagnosis not present

## 2015-12-12 DIAGNOSIS — Z0000 Encounter for general adult medical examination without abnormal findings: Secondary | ICD-10-CM | POA: Diagnosis not present

## 2015-12-12 DIAGNOSIS — F3181 Bipolar II disorder: Secondary | ICD-10-CM | POA: Diagnosis not present

## 2015-12-12 DIAGNOSIS — Z23 Encounter for immunization: Secondary | ICD-10-CM | POA: Diagnosis not present

## 2015-12-12 DIAGNOSIS — F418 Other specified anxiety disorders: Secondary | ICD-10-CM | POA: Diagnosis not present

## 2015-12-12 DIAGNOSIS — E0590 Thyrotoxicosis, unspecified without thyrotoxic crisis or storm: Secondary | ICD-10-CM | POA: Diagnosis not present

## 2016-02-05 DIAGNOSIS — Z6824 Body mass index (BMI) 24.0-24.9, adult: Secondary | ICD-10-CM | POA: Diagnosis not present

## 2016-02-05 DIAGNOSIS — J040 Acute laryngitis: Secondary | ICD-10-CM | POA: Diagnosis not present

## 2016-03-26 DIAGNOSIS — F3160 Bipolar disorder, current episode mixed, unspecified: Secondary | ICD-10-CM | POA: Diagnosis not present

## 2016-03-26 DIAGNOSIS — F418 Other specified anxiety disorders: Secondary | ICD-10-CM | POA: Diagnosis not present

## 2016-03-26 DIAGNOSIS — E0590 Thyrotoxicosis, unspecified without thyrotoxic crisis or storm: Secondary | ICD-10-CM | POA: Diagnosis not present

## 2016-03-26 DIAGNOSIS — F17200 Nicotine dependence, unspecified, uncomplicated: Secondary | ICD-10-CM | POA: Diagnosis not present

## 2016-05-08 DIAGNOSIS — E039 Hypothyroidism, unspecified: Secondary | ICD-10-CM | POA: Diagnosis not present

## 2020-02-09 DIAGNOSIS — R112 Nausea with vomiting, unspecified: Secondary | ICD-10-CM | POA: Diagnosis not present

## 2020-02-09 DIAGNOSIS — Z20828 Contact with and (suspected) exposure to other viral communicable diseases: Secondary | ICD-10-CM | POA: Diagnosis not present

## 2020-07-02 DIAGNOSIS — Z6823 Body mass index (BMI) 23.0-23.9, adult: Secondary | ICD-10-CM | POA: Diagnosis not present

## 2020-07-02 DIAGNOSIS — A5901 Trichomonal vulvovaginitis: Secondary | ICD-10-CM | POA: Diagnosis not present

## 2020-07-02 DIAGNOSIS — R87629 Unspecified abnormal cytological findings in specimens from vagina: Secondary | ICD-10-CM | POA: Diagnosis not present

## 2020-07-02 DIAGNOSIS — N760 Acute vaginitis: Secondary | ICD-10-CM | POA: Diagnosis not present

## 2020-07-08 DIAGNOSIS — M654 Radial styloid tenosynovitis [de Quervain]: Secondary | ICD-10-CM | POA: Diagnosis not present

## 2020-07-16 DIAGNOSIS — F17210 Nicotine dependence, cigarettes, uncomplicated: Secondary | ICD-10-CM | POA: Diagnosis not present

## 2020-07-16 DIAGNOSIS — K219 Gastro-esophageal reflux disease without esophagitis: Secondary | ICD-10-CM | POA: Diagnosis not present

## 2020-07-16 DIAGNOSIS — Z6824 Body mass index (BMI) 24.0-24.9, adult: Secondary | ICD-10-CM | POA: Diagnosis not present

## 2020-08-13 DIAGNOSIS — Z6824 Body mass index (BMI) 24.0-24.9, adult: Secondary | ICD-10-CM | POA: Diagnosis not present

## 2020-08-13 DIAGNOSIS — M62830 Muscle spasm of back: Secondary | ICD-10-CM | POA: Diagnosis not present

## 2020-08-23 DIAGNOSIS — R55 Syncope and collapse: Secondary | ICD-10-CM | POA: Diagnosis not present

## 2020-08-23 DIAGNOSIS — Z6823 Body mass index (BMI) 23.0-23.9, adult: Secondary | ICD-10-CM | POA: Diagnosis not present

## 2020-08-27 DIAGNOSIS — Z6824 Body mass index (BMI) 24.0-24.9, adult: Secondary | ICD-10-CM | POA: Diagnosis not present

## 2020-08-27 DIAGNOSIS — M549 Dorsalgia, unspecified: Secondary | ICD-10-CM | POA: Diagnosis not present

## 2020-08-27 DIAGNOSIS — M62830 Muscle spasm of back: Secondary | ICD-10-CM | POA: Diagnosis not present

## 2020-09-03 DIAGNOSIS — R87620 Atypical squamous cells of undetermined significance on cytologic smear of vagina (ASC-US): Secondary | ICD-10-CM | POA: Diagnosis not present

## 2020-09-03 DIAGNOSIS — R87811 Vaginal high risk human papillomavirus (HPV) DNA test positive: Secondary | ICD-10-CM | POA: Diagnosis not present

## 2020-09-13 DIAGNOSIS — R55 Syncope and collapse: Secondary | ICD-10-CM | POA: Diagnosis not present

## 2020-09-13 DIAGNOSIS — I361 Nonrheumatic tricuspid (valve) insufficiency: Secondary | ICD-10-CM | POA: Diagnosis not present

## 2020-09-25 DIAGNOSIS — R2689 Other abnormalities of gait and mobility: Secondary | ICD-10-CM | POA: Diagnosis not present

## 2020-09-25 DIAGNOSIS — M546 Pain in thoracic spine: Secondary | ICD-10-CM | POA: Diagnosis not present

## 2020-09-25 DIAGNOSIS — M5489 Other dorsalgia: Secondary | ICD-10-CM | POA: Diagnosis not present

## 2020-09-25 DIAGNOSIS — M461 Sacroiliitis, not elsewhere classified: Secondary | ICD-10-CM | POA: Diagnosis not present

## 2020-10-08 DIAGNOSIS — M654 Radial styloid tenosynovitis [de Quervain]: Secondary | ICD-10-CM | POA: Diagnosis not present

## 2020-10-15 DIAGNOSIS — M65841 Other synovitis and tenosynovitis, right hand: Secondary | ICD-10-CM | POA: Diagnosis not present

## 2020-10-15 DIAGNOSIS — M654 Radial styloid tenosynovitis [de Quervain]: Secondary | ICD-10-CM | POA: Diagnosis not present

## 2020-10-15 HISTORY — PX: WRIST SURGERY: SHX841

## 2020-12-02 DIAGNOSIS — J029 Acute pharyngitis, unspecified: Secondary | ICD-10-CM | POA: Diagnosis not present

## 2020-12-02 DIAGNOSIS — J209 Acute bronchitis, unspecified: Secondary | ICD-10-CM | POA: Diagnosis not present

## 2020-12-02 DIAGNOSIS — Z20822 Contact with and (suspected) exposure to covid-19: Secondary | ICD-10-CM | POA: Diagnosis not present

## 2020-12-02 DIAGNOSIS — R0981 Nasal congestion: Secondary | ICD-10-CM | POA: Diagnosis not present

## 2021-02-27 DIAGNOSIS — Z6824 Body mass index (BMI) 24.0-24.9, adult: Secondary | ICD-10-CM | POA: Diagnosis not present

## 2021-02-27 DIAGNOSIS — M545 Low back pain, unspecified: Secondary | ICD-10-CM | POA: Diagnosis not present

## 2021-03-18 DIAGNOSIS — E785 Hyperlipidemia, unspecified: Secondary | ICD-10-CM | POA: Diagnosis not present

## 2021-03-18 DIAGNOSIS — M25551 Pain in right hip: Secondary | ICD-10-CM | POA: Diagnosis not present

## 2021-03-18 DIAGNOSIS — Z6826 Body mass index (BMI) 26.0-26.9, adult: Secondary | ICD-10-CM | POA: Diagnosis not present

## 2021-03-18 DIAGNOSIS — M160 Bilateral primary osteoarthritis of hip: Secondary | ICD-10-CM | POA: Diagnosis not present

## 2021-03-18 DIAGNOSIS — M2578 Osteophyte, vertebrae: Secondary | ICD-10-CM | POA: Diagnosis not present

## 2021-03-18 DIAGNOSIS — E0590 Thyrotoxicosis, unspecified without thyrotoxic crisis or storm: Secondary | ICD-10-CM | POA: Diagnosis not present

## 2021-03-18 DIAGNOSIS — Z1321 Encounter for screening for nutritional disorder: Secondary | ICD-10-CM | POA: Diagnosis not present

## 2021-03-18 DIAGNOSIS — Z91018 Allergy to other foods: Secondary | ICD-10-CM | POA: Diagnosis not present

## 2021-03-18 DIAGNOSIS — Z7689 Persons encountering health services in other specified circumstances: Secondary | ICD-10-CM | POA: Diagnosis not present

## 2021-03-18 DIAGNOSIS — M47816 Spondylosis without myelopathy or radiculopathy, lumbar region: Secondary | ICD-10-CM | POA: Diagnosis not present

## 2021-03-18 DIAGNOSIS — M5441 Lumbago with sciatica, right side: Secondary | ICD-10-CM | POA: Diagnosis not present

## 2021-03-18 DIAGNOSIS — M545 Low back pain, unspecified: Secondary | ICD-10-CM | POA: Diagnosis not present

## 2021-03-21 DIAGNOSIS — R531 Weakness: Secondary | ICD-10-CM | POA: Diagnosis not present

## 2021-03-21 DIAGNOSIS — M5416 Radiculopathy, lumbar region: Secondary | ICD-10-CM | POA: Diagnosis not present

## 2021-03-21 DIAGNOSIS — M545 Low back pain, unspecified: Secondary | ICD-10-CM | POA: Diagnosis not present

## 2021-03-21 DIAGNOSIS — R2689 Other abnormalities of gait and mobility: Secondary | ICD-10-CM | POA: Diagnosis not present

## 2021-03-26 DIAGNOSIS — M545 Low back pain, unspecified: Secondary | ICD-10-CM | POA: Diagnosis not present

## 2021-03-26 DIAGNOSIS — R531 Weakness: Secondary | ICD-10-CM | POA: Diagnosis not present

## 2021-03-26 DIAGNOSIS — M5416 Radiculopathy, lumbar region: Secondary | ICD-10-CM | POA: Diagnosis not present

## 2021-03-26 DIAGNOSIS — R2689 Other abnormalities of gait and mobility: Secondary | ICD-10-CM | POA: Diagnosis not present

## 2021-04-02 DIAGNOSIS — R2689 Other abnormalities of gait and mobility: Secondary | ICD-10-CM | POA: Diagnosis not present

## 2021-04-02 DIAGNOSIS — M5416 Radiculopathy, lumbar region: Secondary | ICD-10-CM | POA: Diagnosis not present

## 2021-04-02 DIAGNOSIS — R531 Weakness: Secondary | ICD-10-CM | POA: Diagnosis not present

## 2021-04-02 DIAGNOSIS — M545 Low back pain, unspecified: Secondary | ICD-10-CM | POA: Diagnosis not present

## 2021-04-07 DIAGNOSIS — M545 Low back pain, unspecified: Secondary | ICD-10-CM | POA: Diagnosis not present

## 2021-04-09 DIAGNOSIS — M545 Low back pain, unspecified: Secondary | ICD-10-CM | POA: Diagnosis not present

## 2021-04-09 DIAGNOSIS — M5416 Radiculopathy, lumbar region: Secondary | ICD-10-CM | POA: Diagnosis not present

## 2021-04-09 DIAGNOSIS — R2689 Other abnormalities of gait and mobility: Secondary | ICD-10-CM | POA: Diagnosis not present

## 2021-04-09 DIAGNOSIS — R531 Weakness: Secondary | ICD-10-CM | POA: Diagnosis not present

## 2021-04-15 DIAGNOSIS — M5451 Vertebrogenic low back pain: Secondary | ICD-10-CM | POA: Diagnosis not present

## 2021-04-18 DIAGNOSIS — M5416 Radiculopathy, lumbar region: Secondary | ICD-10-CM | POA: Diagnosis not present

## 2021-04-18 DIAGNOSIS — M545 Low back pain, unspecified: Secondary | ICD-10-CM | POA: Diagnosis not present

## 2021-04-18 DIAGNOSIS — M5116 Intervertebral disc disorders with radiculopathy, lumbar region: Secondary | ICD-10-CM | POA: Diagnosis not present

## 2021-04-22 DIAGNOSIS — R531 Weakness: Secondary | ICD-10-CM | POA: Diagnosis not present

## 2021-04-22 DIAGNOSIS — M5416 Radiculopathy, lumbar region: Secondary | ICD-10-CM | POA: Diagnosis not present

## 2021-04-22 DIAGNOSIS — M545 Low back pain, unspecified: Secondary | ICD-10-CM | POA: Diagnosis not present

## 2021-04-22 DIAGNOSIS — R2689 Other abnormalities of gait and mobility: Secondary | ICD-10-CM | POA: Diagnosis not present

## 2021-05-02 DIAGNOSIS — M5451 Vertebrogenic low back pain: Secondary | ICD-10-CM | POA: Diagnosis not present

## 2021-05-05 ENCOUNTER — Ambulatory Visit: Payer: Self-pay | Admitting: Orthopedic Surgery

## 2021-05-21 NOTE — Progress Notes (Signed)
Surgical Instructions ? ? ? Your procedure is scheduled on Friday, May 5th, 2023. ? ? Report to [facility] Main Entrance "A" at 11:00 A.M., then check in with the Admitting office. ? Call this number if you have problems the morning of surgery: ? [phone] ? ? If you have any questions prior to your surgery date call [phone]: Open Monday-Friday 8am-4pm ? ? ? Remember: ? Do not eat after midnight the night before your surgery ? ?You may drink clear liquids until 10:00 the morning of your surgery.   ?Clear liquids allowed are: Water, Non-Citrus Juices (without pulp), Carbonated Beverages, Clear Tea, Black Coffee ONLY (NO MILK, CREAM OR POWDERED CREAMER of any kind), and Gatorade ? ?Patient Instructions ? ?The night before surgery:  ?No food after midnight. ONLY clear liquids after midnight ? ?The day of surgery (if you do NOT have diabetes):  ?Drink ONE (1) Pre-Surgery Clear Ensure by 10:00 the morning of surgery. Drink in one sitting. Do not sip.  ?This drink was given to you during your hospital  ?pre-op appointment visit. ? ?Nothing else to drink after completing the  ?Pre-Surgery Clear Ensure. ? ?       If you have questions, please contact your surgeon?s office.  ?  ? Take these medicines the morning of surgery with A SIP OF WATER:  ? ?ARIPiprazole (ABILIFY)  ?methimazole (TAPAZOLE)  ?simvastatin (ZOCOR) ?gabapentin (NEURONTIN) - if needed ? ?As of today, STOP taking any Aspirin (unless otherwise instructed by your surgeon) Aleve, Naproxen, Ibuprofen, Motrin, Advil, Goody's, BC's, all herbal medications, fish oil, and all vitamins. ? ? ? The day of surgery: ?         ?Do not wear jewelry or makeup ?Do not wear lotions, powders, perfumes, or deodorant. ?Do not shave 48 hours prior to surgery.   ?Do not bring valuables to the hospital. ?Do not wear nail polish, gel polish, artificial nails, or any other type of covering on natural nails (fingers and toes) ?If you have artificial nails or gel coating that  need to be removed by a nail salon, please have this removed prior to surgery. Artificial nails or gel coating may interfere with anesthesia's ability to adequately monitor your vital signs. ? ?[facility] is not responsible for any belongings or valuables. .  ? ?Do NOT Smoke (Tobacco/Vaping)  24 hours prior to your procedure ? ?If you use a CPAP at night, you may bring your mask for your overnight stay. ?  ?Contacts, glasses, hearing aids, dentures or partials may not be worn into surgery, please bring cases for these belongings ?  ?For patients admitted to the hospital, discharge time will be determined by your treatment team. ?  ?Patients discharged the day of surgery will not be allowed to drive home, and someone needs to stay with them for 24 hours. ? ? ?SURGICAL WAITING ROOM VISITATION ?Patients having surgery or a procedure in a hospital may have two support people. ?Children under the age of 75 must have an adult with them who is not the patient. ?They may stay in the waiting area during the procedure and may switch out with other visitors. If the patient needs to stay at the hospital during part of their recovery, the visitor guidelines for inpatient rooms apply. ? ?Please refer to the [facility] website for the visitor guidelines for Inpatients (after your surgery is over and you are in a regular room).  ? ? ?Special instructions:   ? ?Oral Hygiene is also important  to reduce your risk of infection.  Remember - BRUSH YOUR TEETH THE MORNING OF SURGERY WITH YOUR REGULAR TOOTHPASTE ? ? ?[facility]- Preparing For Surgery ? ?Before surgery, you can play an important role. Because skin is not sterile, your skin needs to be as free of germs as possible. You can reduce the number of germs on your skin by washing with CHG (chlorahexidine gluconate) Soap before surgery.  CHG is an antiseptic cleaner which kills germs and bonds with the skin to continue killing germs even after washing.   ? ? ?Please do not use if  you have an allergy to CHG or antibacterial soaps. If your skin becomes reddened/irritated stop using the CHG.  ?Do not shave (including legs and underarms) for at least 48 hours prior to first CHG shower. It is OK to shave your face. ? ?Please follow these instructions carefully. ?  ? ? Shower the NIGHT BEFORE SURGERY and the MORNING OF SURGERY with CHG Soap.  ? If you chose to wash your hair, wash your hair first as usual with your normal shampoo. After you shampoo, rinse your hair and body thoroughly to remove the shampoo.  Then [employer] and genitals (private parts) with your normal soap and rinse thoroughly to remove soap. ? ?After that Use CHG Soap as you would any other liquid soap. You can apply CHG directly to the skin and wash gently with a scrungie or a clean washcloth.  ? ?Apply the CHG Soap to your body ONLY FROM THE NECK DOWN.  Do not use on open wounds or open sores. Avoid contact with your eyes, ears, mouth and genitals (private parts). Wash Face and genitals (private parts)  with your normal soap.  ? ?Wash thoroughly, paying special attention to the area where your surgery will be performed. ? ?Thoroughly rinse your body with warm water from the neck down. ? ?DO NOT shower/wash with your normal soap after using and rinsing off the CHG Soap. ? ?Pat yourself dry with a CLEAN TOWEL. ? ?Wear CLEAN PAJAMAS to bed the night before surgery ? ?Place CLEAN SHEETS on your bed the night before your surgery ? ?DO NOT SLEEP WITH PETS. ? ? ?Day of Surgery: ? ?Take a shower with CHG soap. ?Wear Clean/Comfortable clothing the morning of surgery ?Do not apply any deodorants/lotions.   ?Remember to brush your teeth WITH YOUR REGULAR TOOTHPASTE. ? ? ? ?If you received a COVID test during your pre-op visit, it is requested that you wear a mask when out in public, stay away from anyone that may not be feeling well, and notify your surgeon if you develop symptoms. If you have been in contact with anyone that has tested  positive in the last 10 days, please notify your surgeon. ? ?  ?Please read over the following fact sheets that you were given.   ?

## 2021-05-22 ENCOUNTER — Encounter (HOSPITAL_COMMUNITY): Payer: Self-pay

## 2021-05-22 ENCOUNTER — Ambulatory Visit (HOSPITAL_COMMUNITY)
Admission: RE | Admit: 2021-05-22 | Discharge: 2021-05-22 | Disposition: A | Discharge status: Home / Self Care | Payer: Medicaid Other | Source: Ambulatory Visit | Attending: Specialist | Admitting: Specialist

## 2021-05-22 ENCOUNTER — Other Ambulatory Visit: Payer: Self-pay

## 2021-05-22 ENCOUNTER — Encounter (HOSPITAL_COMMUNITY)
Admission: RE | Admit: 2021-05-22 | Discharge: 2021-05-22 | Disposition: A | Discharge status: Home / Self Care | Payer: Medicaid Other | Source: Ambulatory Visit | Attending: Specialist | Admitting: Specialist

## 2021-05-22 VITALS — BP 80/65 | HR 86 | Temp 98.2°F | Resp 17 | Ht 60.0 in | Wt 136.1 lb

## 2021-05-22 DIAGNOSIS — Z01818 Encounter for other preprocedural examination: Secondary | ICD-10-CM | POA: Insufficient documentation

## 2021-05-22 HISTORY — DX: Bipolar disorder, unspecified: F31.9

## 2021-05-22 HISTORY — DX: Depression, unspecified: F32.A

## 2021-05-22 HISTORY — DX: Anxiety disorder, unspecified: F41.9

## 2021-05-22 HISTORY — DX: Cardiac arrhythmia, unspecified: I49.9

## 2021-05-22 HISTORY — DX: Thyrotoxicosis, unspecified without thyrotoxic crisis or storm: E05.90

## 2021-05-22 HISTORY — DX: Gastro-esophageal reflux disease without esophagitis: K21.9

## 2021-05-22 LAB — CBC
HCT: 43.7 % (ref 36.0–46.0)
Hemoglobin: 14.6 g/dL (ref 12.0–15.0)
MCH: 31.3 pg (ref 26.0–34.0)
MCHC: 33.4 g/dL (ref 30.0–36.0)
MCV: 93.6 fL (ref 80.0–100.0)
Platelets: 331 10*3/uL (ref 150–400)
RBC: 4.67 MIL/uL (ref 3.87–5.11)
RDW: 12.7 % (ref 11.5–15.5)
WBC: 8.8 10*3/uL (ref 4.0–10.5)
nRBC: 0 % (ref 0.0–0.2)

## 2021-05-22 LAB — BASIC METABOLIC PANEL
Anion gap: 7 (ref 5–15)
BUN: 10 mg/dL (ref 6–20)
CO2: 28 mmol/L (ref 22–32)
Calcium: 9.1 mg/dL (ref 8.9–10.3)
Chloride: 104 mmol/L (ref 98–111)
Creatinine, Ser: 0.94 mg/dL (ref 0.44–1.00)
GFR, Estimated: >60 mL/min (ref >60)
Glucose, Bld: 79 mg/dL (ref 70–99)
Potassium: 3.9 mmol/L (ref 3.5–5.1)
Sodium: 139 mmol/L (ref 135–145)

## 2021-05-22 LAB — SURGICAL PCR SCREEN
MRSA, PCR: NEGATIVE
Staphylococcus aureus: NEGATIVE

## 2021-05-22 NOTE — Progress Notes (Addendum)
PCP:  Geoffry Paradise, PA ?Cardiologist:  Dr. Norman Herrlich ? ?EKG:  05/22/21 ?CXR:  na ?ECHO:  11/05/20 ?Stress Test:  denies ?Cardiac Cath:  denies ? ?Fasting Blood Sugar-  na ?Checks Blood Sugar__na_ times a day ? ?OSA/CPAP: No ? ?ASA/Blood Thinner: No ? ?Covid test not needed ? ?Anesthesia Review: Yes, history SVT with ablations.  Requested records from Dr. Dulce Sellar.  Spoke to [place], [place] regarding low BP.  This is normal for patient and Fayrene Fearing is okay with the reading.  ? ?Patient denies shortness of breath, fever, cough, and chest pain at PAT appointment. ? ?Patient verbalized understanding of instructions provided today at the PAT appointment.  Patient asked to review instructions at home and day of surgery.   ?

## 2021-05-23 ENCOUNTER — Encounter (HOSPITAL_COMMUNITY): Payer: Self-pay

## 2021-05-23 NOTE — Anesthesia Preprocedure Evaluation (Addendum)
Anesthesia Evaluation  ?Patient identified by MRN, date of birth, ID band ?Patient awake ? ? ? ?Reviewed: ?Allergy & Precautions, NPO status , Patient's Chart, lab work & pertinent test results ? ?History of Anesthesia Complications ?Negative for: history of anesthetic complications ? ?Airway ?Mallampati: I ? ?TM Distance: >3 FB ?Neck ROM: Full ? ? ? Dental ? ?(+) Partial Upper, Dental Advisory Given,  ?  ?Pulmonary ?neg shortness of breath, neg COPD, neg recent URI, Current Smoker,  ?  ?breath sounds clear to auscultation ? ? ? ? ? ? Cardiovascular ?+ dysrhythmias Supra Ventricular Tachycardia  ?Rhythm:Regular  ?? ?EKG 05/22/2021: Sinus bradycardia.  Poor R wave progression.  Rate 56. ?? ?TTE 09/13/2020: ?Conclusions: ?1.  Overall left ventricular systolic function was normal, with an EF between 60 to 65%.  No regional wall motion abnormalities were noted. ?2.  The left atrium is normal in size. ?3.  Mild to moderate tricuspid rotation present. ? ?Svt s/p ablation ?  ?Neuro/Psych ?  ? GI/Hepatic ?Neg liver ROS, GERD  Controlled,  ?Endo/Other  ?Hyperthyroidism  ? Renal/GU ?negative Renal ROS  ? ?  ?Musculoskeletal ?negative musculoskeletal ROS ?(+)  ? Abdominal ?  ?Peds ? Hematology ?negative hematology ROS ?(+)   ?Anesthesia Other Findings ? ? Reproductive/Obstetrics ? ?  ? ? ? ? ? ? ? ? ? ? ? ? ? ?  ?  ? ? ? ? ? ? ?Anesthesia Physical ?Anesthesia Plan ? ?ASA: 2 ? ?Anesthesia Plan: General  ? ?Post-op Pain Management: Ofirmev IV (intra-op)*  ? ?Induction: Intravenous ? ?PONV Risk Score and Plan: 2 and Ondansetron and Dexamethasone ? ?Airway Management Planned: Oral ETT ? ?Additional Equipment:  ? ?Intra-op Plan:  ? ?Post-operative Plan: Extubation in OR ? ?Informed Consent: I have reviewed the patients History and Physical, chart, labs and discussed the procedure including the risks, benefits and alternatives for the proposed anesthesia with the patient or authorized representative  who has indicated his/her understanding and acceptance.  ? ? ? ?Dental advisory given ? ?Plan Discussed with: CRNA ? ?Anesthesia Plan Comments: (PAT note by Antionette Poles, PA-C: ?Patient reports remote history of SVT s/p ablation 2004.  Most recent echocardiogram scanned into epic shows normal EF 60 to 65%, normal wall motion, mild to moderate TR. ? ?History of hypothyroidism maintained on methimazole. ? ?Blood pressure noted to be low at preop, 80/65.  Patient asymptomatic.  She reports this is typical for her, systolic usually in the low 90s. ? ?Preop labs reviewed, WNL. ? ?EKG 05/22/2021: Sinus bradycardia.  Poor R wave progression.  Rate 56. ? ?TTE 09/13/2020: ?Conclusions: ?1.  Overall left ventricular systolic function was normal, with an EF between 60 to 65%.  No regional wall motion abnormalities were noted. ?2.  The left atrium is normal in size. ?3.  Mild to moderate tricuspid rotation present. ?)  ? ? ? ? ? ?Anesthesia Quick Evaluation ? ?

## 2021-05-23 NOTE — Progress Notes (Signed)
Anesthesia Chart Review: ? ?Patient reports remote history of SVT s/p ablation 2004.  Most recent echocardiogram scanned into epic shows normal EF 60 to 65%, normal wall motion, mild to moderate TR. ? ?History of hypothyroidism maintained on methimazole. ? ?Blood pressure noted to be low at preop, 80/65.  Patient asymptomatic.  She reports this is typical for her, systolic usually in the low 90s. ? ?Preop labs reviewed, WNL. ? ?EKG 05/22/2021: Sinus bradycardia.  Poor R wave progression.  Rate 56. ? ?TTE 09/13/2020: ?Conclusions: ?1.  Overall left ventricular systolic function was normal, with an EF between 60 to 65%.  No regional wall motion abnormalities were noted. ?2.  The left atrium is normal in size. ?3.  Mild to moderate tricuspid rotation present. ? ? ? ?Andrea Poles, PA-C ?[facility]/Anesthesiology ?Phone [phone] ?05/23/2021 11:37 AM ? ?

## 2021-05-30 ENCOUNTER — Encounter (HOSPITAL_COMMUNITY): Payer: Self-pay | Admitting: Specialist

## 2021-05-30 ENCOUNTER — Ambulatory Visit (HOSPITAL_COMMUNITY): Payer: Medicaid Other

## 2021-05-30 ENCOUNTER — Ambulatory Visit: Payer: Self-pay | Admitting: Orthopedic Surgery

## 2021-05-30 ENCOUNTER — Ambulatory Visit (HOSPITAL_COMMUNITY): Payer: Medicaid Other | Admitting: Physician Assistant

## 2021-05-30 ENCOUNTER — Ambulatory Visit (HOSPITAL_BASED_OUTPATIENT_CLINIC_OR_DEPARTMENT_OTHER): Payer: Medicaid Other | Admitting: Certified Registered Nurse Anesthetist

## 2021-05-30 ENCOUNTER — Ambulatory Visit (HOSPITAL_COMMUNITY)
Admission: RE | Disposition: A | Discharge status: Home / Self Care | Payer: Self-pay | Source: Ambulatory Visit | Attending: Specialist

## 2021-05-30 ENCOUNTER — Ambulatory Visit (HOSPITAL_COMMUNITY)
Admission: RE | Admit: 2021-05-30 | Discharge: 2021-05-30 | Disposition: A | Discharge status: Home / Self Care | Payer: Medicaid Other | Source: Ambulatory Visit | Attending: Specialist | Admitting: Specialist

## 2021-05-30 DIAGNOSIS — F419 Anxiety disorder, unspecified: Secondary | ICD-10-CM | POA: Diagnosis not present

## 2021-05-30 DIAGNOSIS — Z981 Arthrodesis status: Secondary | ICD-10-CM | POA: Diagnosis not present

## 2021-05-30 DIAGNOSIS — F319 Bipolar disorder, unspecified: Secondary | ICD-10-CM | POA: Insufficient documentation

## 2021-05-30 DIAGNOSIS — M5127 Other intervertebral disc displacement, lumbosacral region: Secondary | ICD-10-CM | POA: Insufficient documentation

## 2021-05-30 DIAGNOSIS — F17210 Nicotine dependence, cigarettes, uncomplicated: Secondary | ICD-10-CM | POA: Insufficient documentation

## 2021-05-30 DIAGNOSIS — E0590 Thyrotoxicosis, unspecified without thyrotoxic crisis or storm: Secondary | ICD-10-CM | POA: Diagnosis not present

## 2021-05-30 DIAGNOSIS — F1290 Cannabis use, unspecified, uncomplicated: Secondary | ICD-10-CM | POA: Insufficient documentation

## 2021-05-30 DIAGNOSIS — M40299 Other kyphosis, site unspecified: Secondary | ICD-10-CM | POA: Diagnosis not present

## 2021-05-30 DIAGNOSIS — K219 Gastro-esophageal reflux disease without esophagitis: Secondary | ICD-10-CM | POA: Insufficient documentation

## 2021-05-30 DIAGNOSIS — M5126 Other intervertebral disc displacement, lumbar region: Secondary | ICD-10-CM | POA: Diagnosis present

## 2021-05-30 DIAGNOSIS — M4187 Other forms of scoliosis, lumbosacral region: Secondary | ICD-10-CM | POA: Diagnosis not present

## 2021-05-30 DIAGNOSIS — Z79899 Other long term (current) drug therapy: Secondary | ICD-10-CM | POA: Insufficient documentation

## 2021-05-30 DIAGNOSIS — M4807 Spinal stenosis, lumbosacral region: Secondary | ICD-10-CM | POA: Diagnosis not present

## 2021-05-30 HISTORY — PX: LUMBAR LAMINECTOMY/DECOMPRESSION MICRODISCECTOMY: SHX5026

## 2021-05-30 HISTORY — DX: Other intervertebral disc displacement, lumbar region: M51.26

## 2021-05-30 SURGERY — LUMBAR LAMINECTOMY/DECOMPRESSION MICRODISCECTOMY 1 LEVEL
Anesthesia: General | Laterality: Right

## 2021-05-30 MED ORDER — VITAMIN B-12 1000 MCG PO TABS: 1000.0000 ug | ORAL_TABLET | Freq: Every day | ORAL | Status: DC

## 2021-05-30 MED ORDER — DOCUSATE SODIUM 100 MG PO CAPS: 100.0000 mg | ORAL_CAPSULE | Freq: Two times a day (BID) | ORAL | Status: DC

## 2021-05-30 MED ORDER — CEFAZOLIN SODIUM-DEXTROSE 2-4 GM/100ML-% IV SOLN: 2.0000 g | INTRAVENOUS | Status: DC

## 2021-05-30 MED ORDER — METHOCARBAMOL 500 MG PO TABS: 500.0000 mg | ORAL_TABLET | Freq: Four times a day (QID) | ORAL | 1 refills | Status: DC | PRN

## 2021-05-30 MED ORDER — ALUM & MAG HYDROXIDE-SIMETH 200-200-20 MG/5ML PO SUSP: 30.0000 mL | Freq: Four times a day (QID) | ORAL | Status: DC | PRN

## 2021-05-30 MED ORDER — CEFAZOLIN SODIUM-DEXTROSE 1-4 GM/50ML-% IV SOLN
1.0000 g | Freq: Three times a day (TID) | INTRAVENOUS | Status: DC
  Administered 2021-05-30: 1 g via INTRAVENOUS
  Filled 2021-05-30: qty 50

## 2021-05-30 MED ORDER — ACETAMINOPHEN 10 MG/ML IV SOLN
1000.0000 mg | INTRAVENOUS | Status: DC
  Filled 2021-05-30: qty 100

## 2021-05-30 MED ORDER — DOCUSATE SODIUM 100 MG PO CAPS: 100.0000 mg | ORAL_CAPSULE | Freq: Two times a day (BID) | ORAL | 2 refills | Status: DC | PRN

## 2021-05-30 MED ORDER — ONDANSETRON HCL 4 MG/2ML IJ SOLN
INTRAMUSCULAR | Status: DC | PRN
  Administered 2021-05-30: 4 mg via INTRAVENOUS

## 2021-05-30 MED ORDER — ACETAMINOPHEN 10 MG/ML IV SOLN: 1000.0000 mg | Freq: Once | INTRAVENOUS | Status: DC | PRN

## 2021-05-30 MED ORDER — GABAPENTIN 300 MG PO CAPS: 300.0000 mg | ORAL_CAPSULE | Freq: Three times a day (TID) | ORAL | Status: DC | PRN

## 2021-05-30 MED ORDER — FENTANYL CITRATE (PF) 250 MCG/5ML IJ SOLN
INTRAMUSCULAR | Status: AC
  Filled 2021-05-30: qty 5

## 2021-05-30 MED ORDER — MIDAZOLAM HCL 2 MG/2ML IJ SOLN
INTRAMUSCULAR | Status: AC
  Filled 2021-05-30: qty 2

## 2021-05-30 MED ORDER — POLYETHYLENE GLYCOL 3350 17 G PO PACK: 17.0000 g | PACK | Freq: Every day | ORAL | 0 refills | Status: DC

## 2021-05-30 MED ORDER — BISACODYL 5 MG PO TBEC: 5.0000 mg | DELAYED_RELEASE_TABLET | Freq: Every day | ORAL | Status: DC | PRN

## 2021-05-30 MED ORDER — FENTANYL CITRATE (PF) 100 MCG/2ML IJ SOLN
INTRAMUSCULAR | Status: AC
  Filled 2021-05-30: qty 2

## 2021-05-30 MED ORDER — OXYCODONE HCL 5 MG PO TABS
5.0000 mg | ORAL_TABLET | ORAL | Status: DC | PRN
  Administered 2021-05-30: 5 mg via ORAL
  Filled 2021-05-30: qty 1

## 2021-05-30 MED ORDER — OXYCODONE HCL 5 MG PO TABS: 5.0000 mg | ORAL_TABLET | Freq: Once | ORAL | Status: DC | PRN

## 2021-05-30 MED ORDER — SUGAMMADEX SODIUM 200 MG/2ML IV SOLN
INTRAVENOUS | Status: DC | PRN
  Administered 2021-05-30: 20 mg via INTRAVENOUS
  Administered 2021-05-30: 80 mg via INTRAVENOUS

## 2021-05-30 MED ORDER — FENTANYL CITRATE (PF) 100 MCG/2ML IJ SOLN
25.0000 ug | INTRAMUSCULAR | Status: DC | PRN
  Administered 2021-05-30: 25 ug via INTRAVENOUS

## 2021-05-30 MED ORDER — ROCURONIUM BROMIDE 10 MG/ML (PF) SYRINGE
PREFILLED_SYRINGE | INTRAVENOUS | Status: DC | PRN
  Administered 2021-05-30: 60 mg via INTRAVENOUS

## 2021-05-30 MED ORDER — OXYCODONE HCL 5 MG/5ML PO SOLN: 5.0000 mg | Freq: Once | ORAL | Status: DC | PRN

## 2021-05-30 MED ORDER — ACETAMINOPHEN 10 MG/ML IV SOLN
INTRAVENOUS | Status: AC
  Filled 2021-05-30: qty 100

## 2021-05-30 MED ORDER — LACTATED RINGERS IV SOLN: INTRAVENOUS | Status: DC

## 2021-05-30 MED ORDER — FENTANYL CITRATE (PF) 250 MCG/5ML IJ SOLN
INTRAMUSCULAR | Status: DC | PRN
  Administered 2021-05-30: 50 ug via INTRAVENOUS
  Administered 2021-05-30: 100 ug via INTRAVENOUS
  Administered 2021-05-30 (×2): 50 ug via INTRAVENOUS

## 2021-05-30 MED ORDER — METHOCARBAMOL 1000 MG/10ML IJ SOLN: 500.0000 mg | Freq: Four times a day (QID) | INTRAVENOUS | Status: DC | PRN

## 2021-05-30 MED ORDER — PHENOL 1.4 % MT LIQD: 1.0000 | OROMUCOSAL | Status: DC | PRN

## 2021-05-30 MED ORDER — ROCURONIUM BROMIDE 10 MG/ML (PF) SYRINGE
PREFILLED_SYRINGE | INTRAVENOUS | Status: AC
  Filled 2021-05-30: qty 10

## 2021-05-30 MED ORDER — METHIMAZOLE 5 MG PO TABS
5.0000 mg | ORAL_TABLET | Freq: Every day | ORAL | Status: DC
  Filled 2021-05-30: qty 1

## 2021-05-30 MED ORDER — ARIPIPRAZOLE 10 MG PO TABS
20.0000 mg | ORAL_TABLET | Freq: Every day | ORAL | Status: DC
  Filled 2021-05-30: qty 2

## 2021-05-30 MED ORDER — LIDOCAINE 2% (20 MG/ML) 5 ML SYRINGE
INTRAMUSCULAR | Status: DC | PRN
  Administered 2021-05-30: 40 mg via INTRAVENOUS

## 2021-05-30 MED ORDER — ONDANSETRON HCL 4 MG/2ML IJ SOLN
INTRAMUSCULAR | Status: AC
  Filled 2021-05-30: qty 2

## 2021-05-30 MED ORDER — ACETAMINOPHEN 325 MG PO TABS: 650.0000 mg | ORAL_TABLET | ORAL | Status: DC | PRN

## 2021-05-30 MED ORDER — PROMETHAZINE HCL 25 MG/ML IJ SOLN
6.2500 mg | Freq: Once | INTRAMUSCULAR | Status: DC
Start: 2021-05-30 — End: 2021-05-30

## 2021-05-30 MED ORDER — CHLORHEXIDINE GLUCONATE 0.12 % MT SOLN
15.0000 mL | Freq: Once | OROMUCOSAL | Status: AC
  Administered 2021-05-30: 15 mL via OROMUCOSAL
  Filled 2021-05-30: qty 15

## 2021-05-30 MED ORDER — ACETAMINOPHEN 650 MG RE SUPP: 650.0000 mg | RECTAL | Status: DC | PRN

## 2021-05-30 MED ORDER — LIDOCAINE 2% (20 MG/ML) 5 ML SYRINGE
INTRAMUSCULAR | Status: AC
  Filled 2021-05-30: qty 5

## 2021-05-30 MED ORDER — DEXAMETHASONE SODIUM PHOSPHATE 10 MG/ML IJ SOLN
INTRAMUSCULAR | Status: DC | PRN
  Administered 2021-05-30: 10 mg via INTRAVENOUS

## 2021-05-30 MED ORDER — AMISULPRIDE (ANTIEMETIC) 5 MG/2ML IV SOLN
INTRAVENOUS | Status: DC
Start: 2021-05-30 — End: 2021-05-31
  Filled 2021-05-30: qty 2

## 2021-05-30 MED ORDER — DEXAMETHASONE SODIUM PHOSPHATE 10 MG/ML IJ SOLN
INTRAMUSCULAR | Status: AC
  Filled 2021-05-30: qty 1

## 2021-05-30 MED ORDER — RISAQUAD PO CAPS
1.0000 | ORAL_CAPSULE | Freq: Every day | ORAL | Status: DC
Start: 2021-05-30 — End: 2021-05-31
  Administered 2021-05-30: 1 via ORAL
  Filled 2021-05-30: qty 1

## 2021-05-30 MED ORDER — PROPOFOL 10 MG/ML IV BOLUS
INTRAVENOUS | Status: DC | PRN
  Administered 2021-05-30: 20 mg via INTRAVENOUS
  Administered 2021-05-30: 30 mg via INTRAVENOUS
  Administered 2021-05-30: 130 mg via INTRAVENOUS

## 2021-05-30 MED ORDER — MENTHOL 3 MG MT LOZG: 1.0000 | LOZENGE | OROMUCOSAL | Status: DC | PRN

## 2021-05-30 MED ORDER — ACETAMINOPHEN 500 MG PO TABS: 1000.0000 mg | ORAL_TABLET | Freq: Once | ORAL | Status: DC | PRN

## 2021-05-30 MED ORDER — BUPIVACAINE-EPINEPHRINE 0.5% -1:200000 IJ SOLN
INTRAMUSCULAR | Status: DC | PRN
  Administered 2021-05-30: 4 mL

## 2021-05-30 MED ORDER — ACETAMINOPHEN 160 MG/5ML PO SOLN: 1000.0000 mg | Freq: Once | ORAL | Status: DC | PRN

## 2021-05-30 MED ORDER — VITAMIN D3 25 MCG (1000 UNIT) PO TABS: 1000.0000 ug | ORAL_TABLET | ORAL | Status: DC

## 2021-05-30 MED ORDER — ORAL CARE MOUTH RINSE: 15.0000 mL | Freq: Once | OROMUCOSAL | Status: AC

## 2021-05-30 MED ORDER — METHOCARBAMOL 500 MG PO TABS
500.0000 mg | ORAL_TABLET | Freq: Four times a day (QID) | ORAL | Status: DC | PRN
  Administered 2021-05-30: 500 mg via ORAL
  Filled 2021-05-30: qty 1

## 2021-05-30 MED ORDER — ONDANSETRON HCL 4 MG/2ML IJ SOLN: 4.0000 mg | Freq: Four times a day (QID) | INTRAMUSCULAR | Status: DC | PRN

## 2021-05-30 MED ORDER — MIDAZOLAM HCL 5 MG/5ML IJ SOLN
INTRAMUSCULAR | Status: DC | PRN
Start: 2021-05-30 — End: 2021-05-30
  Administered 2021-05-30: 2 mg via INTRAVENOUS

## 2021-05-30 MED ORDER — OXYCODONE HCL 5 MG PO TABS: 5.0000 mg | ORAL_TABLET | ORAL | 0 refills | Status: DC | PRN

## 2021-05-30 MED ORDER — AMISULPRIDE (ANTIEMETIC) 5 MG/2ML IV SOLN
10.0000 mg | Freq: Once | INTRAVENOUS | Status: AC
  Administered 2021-05-30: 10 mg via INTRAVENOUS

## 2021-05-30 MED ORDER — 0.9 % SODIUM CHLORIDE (POUR BTL) OPTIME
TOPICAL | Status: DC | PRN
  Administered 2021-05-30: 1000 mL

## 2021-05-30 MED ORDER — CEFAZOLIN SODIUM-DEXTROSE 2-4 GM/100ML-% IV SOLN
2.0000 g | INTRAVENOUS | Status: AC
  Administered 2021-05-30: 2 g via INTRAVENOUS
  Filled 2021-05-30: qty 100

## 2021-05-30 MED ORDER — BUPIVACAINE-EPINEPHRINE 0.5% -1:200000 IJ SOLN
INTRAMUSCULAR | Status: AC
  Filled 2021-05-30: qty 1

## 2021-05-30 MED ORDER — KCL IN DEXTROSE-NACL 20-5-0.45 MEQ/L-%-% IV SOLN: INTRAVENOUS | Status: DC

## 2021-05-30 MED ORDER — CHLORHEXIDINE GLUCONATE 0.12 % MT SOLN
OROMUCOSAL | Status: AC
  Filled 2021-05-30: qty 15

## 2021-05-30 MED ORDER — ACETAMINOPHEN 500 MG PO TABS
1000.0000 mg | ORAL_TABLET | Freq: Four times a day (QID) | ORAL | Status: DC
  Administered 2021-05-30: 1000 mg via ORAL
  Filled 2021-05-30: qty 2

## 2021-05-30 MED ORDER — THROMBIN 20000 UNITS EX SOLR: CUTANEOUS | Status: DC | PRN

## 2021-05-30 MED ORDER — PROMETHAZINE HCL 25 MG/ML IJ SOLN
INTRAMUSCULAR | Status: AC
  Filled 2021-05-30: qty 1

## 2021-05-30 MED ORDER — ONDANSETRON HCL 4 MG PO TABS: 4.0000 mg | ORAL_TABLET | Freq: Four times a day (QID) | ORAL | Status: DC | PRN

## 2021-05-30 MED ORDER — PROPOFOL 10 MG/ML IV BOLUS
INTRAVENOUS | Status: AC
  Filled 2021-05-30: qty 20

## 2021-05-30 MED ORDER — HYDROMORPHONE HCL 1 MG/ML IJ SOLN: 0.5000 mg | INTRAMUSCULAR | Status: DC | PRN

## 2021-05-30 MED ORDER — THROMBIN 20000 UNITS EX KIT
PACK | CUTANEOUS | Status: AC
  Filled 2021-05-30: qty 1

## 2021-05-30 MED ORDER — POLYETHYLENE GLYCOL 3350 17 G PO PACK: 17.0000 g | PACK | Freq: Every day | ORAL | Status: DC | PRN

## 2021-05-30 SURGICAL SUPPLY — 60 items
BAG COUNTER SPONGE SURGICOUNT (BAG) ×2 IMPLANT
BAG DECANTER FOR FLEXI CONT (MISCELLANEOUS) IMPLANT
BAND RUBBER #18 3X1/16 STRL (MISCELLANEOUS) ×4 IMPLANT
BUR EGG ELITE 5.0 (BURR) IMPLANT
BUR RND DIAMOND ELITE 4.0 (BURR) IMPLANT
BUR STRYKR EGG 5.0 (BURR) IMPLANT
CARTRIDGE OIL MAESTRO DRILL (MISCELLANEOUS) IMPLANT
CLEANER TIP ELECTROSURG 2X2 (MISCELLANEOUS) ×2 IMPLANT
CNTNR URN SCR LID CUP LEK RST (MISCELLANEOUS) ×1 IMPLANT
CONT SPEC 4OZ STRL OR WHT (MISCELLANEOUS) ×1
DIFFUSER DRILL AIR PNEUMATIC (MISCELLANEOUS) IMPLANT
DRAPE LAPAROTOMY 100X72X124 (DRAPES) ×2 IMPLANT
DRAPE MICROSCOPE LEICA (MISCELLANEOUS) ×2 IMPLANT
DRAPE SHEET LG 3/4 BI-LAMINATE (DRAPES) ×2 IMPLANT
DRAPE SURG 17X11 SM STRL (DRAPES) ×2 IMPLANT
DRAPE UTILITY XL STRL (DRAPES) ×2 IMPLANT
DRSG AQUACEL AG ADV 3.5X 4 (GAUZE/BANDAGES/DRESSINGS) ×1 IMPLANT
DRSG AQUACEL AG ADV 3.5X 6 (GAUZE/BANDAGES/DRESSINGS) IMPLANT
DRSG TELFA 3X8 NADH (GAUZE/BANDAGES/DRESSINGS) IMPLANT
DURAPREP 26ML APPLICATOR (WOUND CARE) ×2 IMPLANT
DURASEAL SPINE SEALANT 3ML (MISCELLANEOUS) IMPLANT
ELECT BLADE 4.0 EZ CLEAN MEGAD (MISCELLANEOUS)
ELECT REM PT RETURN 9FT ADLT (ELECTROSURGICAL) ×2
ELECTRODE BLDE 4.0 EZ CLN MEGD (MISCELLANEOUS) IMPLANT
ELECTRODE REM PT RTRN 9FT ADLT (ELECTROSURGICAL) ×1 IMPLANT
GLOVE BIOGEL PI IND STRL 7.0 (GLOVE) ×1 IMPLANT
GLOVE BIOGEL PI INDICATOR 7.0 (GLOVE) ×1
GLOVE SURG SS PI 7.5 STRL IVOR (GLOVE) ×2 IMPLANT
GLOVE SURG SS PI 8.0 STRL IVOR (GLOVE) ×4 IMPLANT
GOWN STRL REUS W/ TWL LRG LVL3 (GOWN DISPOSABLE) ×1 IMPLANT
GOWN STRL REUS W/ TWL XL LVL3 (GOWN DISPOSABLE) ×1 IMPLANT
GOWN STRL REUS W/TWL LRG LVL3 (GOWN DISPOSABLE) ×1
GOWN STRL REUS W/TWL XL LVL3 (GOWN DISPOSABLE) ×1
IV CATH 14GX2 1/4 (CATHETERS) ×2 IMPLANT
KIT BASIN OR (CUSTOM PROCEDURE TRAY) ×2 IMPLANT
NDL SPNL 18GX3.5 QUINCKE PK (NEEDLE) ×2 IMPLANT
NEEDLE 22X1 1/2 (OR ONLY) (NEEDLE) ×2 IMPLANT
NEEDLE SPNL 18GX3.5 QUINCKE PK (NEEDLE) ×4 IMPLANT
OIL CARTRIDGE MAESTRO DRILL (MISCELLANEOUS)
PACK LAMINECTOMY NEURO (CUSTOM PROCEDURE TRAY) ×2 IMPLANT
PAD DRESSING TELFA 3X8 NADH (GAUZE/BANDAGES/DRESSINGS) IMPLANT
PATTIES SURGICAL .75X.75 (GAUZE/BANDAGES/DRESSINGS) ×2 IMPLANT
SPONGE SURGIFOAM ABS GEL 100 (HEMOSTASIS) ×2 IMPLANT
SPONGE T-LAP 4X18 ~~LOC~~+RFID (SPONGE) IMPLANT
STAPLER VISISTAT (STAPLE) IMPLANT
STRIP CLOSURE SKIN 1/2X4 (GAUZE/BANDAGES/DRESSINGS) ×2 IMPLANT
SUT NURALON 4 0 TR CR/8 (SUTURE) IMPLANT
SUT PROLENE 3 0 PS 2 (SUTURE) IMPLANT
SUT VIC AB 1 CT1 27 (SUTURE)
SUT VIC AB 1 CT1 27XBRD ANTBC (SUTURE) IMPLANT
SUT VIC AB 1-0 CT2 27 (SUTURE) IMPLANT
SUT VIC AB 2-0 CT1 27 (SUTURE)
SUT VIC AB 2-0 CT1 TAPERPNT 27 (SUTURE) IMPLANT
SUT VIC AB 2-0 CT2 27 (SUTURE) IMPLANT
SYR 3ML LL SCALE MARK (SYRINGE) ×2 IMPLANT
TOWEL GREEN STERILE (TOWEL DISPOSABLE) ×2 IMPLANT
TOWEL GREEN STERILE FF (TOWEL DISPOSABLE) ×2 IMPLANT
TRAY FOLEY MTR SLVR 16FR STAT (SET/KITS/TRAYS/PACK) ×2 IMPLANT
WIPE CHG CHLORHEXIDINE 2% (PERSONAL CARE ITEMS) ×2 IMPLANT
YANKAUER SUCT BULB TIP NO VENT (SUCTIONS) ×2 IMPLANT

## 2021-05-30 NOTE — H&P (Signed)
Andrea Singleton is an 41 y.o. female.   ?Chief Complaint: back and R leg pain ?HPI: Reason for Visit: (normal) visit for: (back); 2 1/2 months ?Location (Lower Extremity): lower back pain on the right; leg pain on the right, , ?Severity: pain level 6/10 ?Quality: aching; stabbing ?Aggravating Factors: walking for ; twisting/turning ?Alleviating Factors: lying down helps ?Associated Symptoms: tingling right calf and foot ?Medications: Gabapentin 300mg  bid or tid ?Notes: ?04/18/21 S1 SNRB No relief ? ?Past Medical History:  ?Diagnosis Date  ? Anxiety   ? Bipolar disorder (HCC)   ? Depression   ? Dysrhythmia   ? SVT s/p ablation 2004  ? GERD (gastroesophageal reflux disease)   ? Hyperthyroidism   ? ? ?Past Surgical History:  ?Procedure Laterality Date  ? ABDOMINAL HYSTERECTOMY    ? KNEE ARTHROSCOPY Left   ? WRIST SURGERY Right   ? ? ?No family history on file. ?Social History:  reports that she has been smoking cigarettes. She has been smoking an average of .5 packs per day. She has never used smokeless tobacco. She reports that she does not currently use alcohol. She reports current drug use. Drug: Marijuana. ? ?Allergies:  ?Allergies  ?Allergen Reactions  ? Penicillins Other (See Comments)  ?  Allergy Test  ? ?Current meds: ?ARIPiprazole 20 mg tablet ?Ativan 0.5 mg tablet ?cholecalciferol (vitamin D3) 1,250 mcg (50,000 unit) capsule ?EPINEPHrine 0.3 mg/0.3 mL injection, auto-injector ?gabapentin 300 mg capsule ?methIMAzole 5 mg tablet ?simvastatin ? ?Review of Systems  ?Constitutional: Negative.   ?HENT: Negative.    ?Eyes: Negative.   ?Respiratory: Negative.    ?Cardiovascular: Negative.   ?Gastrointestinal: Negative.   ?Endocrine: Negative.   ?Genitourinary: Negative.   ?Musculoskeletal:  Positive for back pain and gait problem.  ?Skin: Negative.   ?Neurological:  Positive for weakness and numbness.  ?Psychiatric/Behavioral: Negative.    ? ?There were no vitals taken for this visit. ?Physical  Exam ?Constitutional:   ?   Appearance: Normal appearance.  ?HENT:  ?   Head: Normocephalic and atraumatic.  ?   Right Ear: External ear normal.  ?   Left Ear: External ear normal.  ?   Nose: Nose normal.  ?   Mouth/Throat:  ?   Pharynx: Oropharynx is clear.  ?Eyes:  ?   Conjunctiva/sclera: Conjunctivae normal.  ?Cardiovascular:  ?   Rate and Rhythm: Normal rate and regular rhythm.  ?   Pulses: Normal pulses.  ?Pulmonary:  ?   Effort: Pulmonary effort is normal.  ?Abdominal:  ?   General: Bowel sounds are normal.  ?Musculoskeletal:  ?   Cervical back: Normal range of motion.  ?   Comments: Gait and Station: Appearance: ambulating with no assistive devices and antalgic gait. ? ?Constitutional: General Appearance: healthy-appearing and distress (mild). ? ?Psychiatric: Mood and Affect: active and alert. ? ?Cardiovascular System: Edema Right: none; Dorsalis and posterior tibial pulses 2+. Edema Left: none. ? ?Abdomen: Inspection and Palpation: non-distended and no tenderness. ? ?Skin: Inspection and palpation: no rash. ? ?Lumbar Spine: Inspection: normal alignment. Bony Palpation of the Lumbar Spine: tender at lumbosacral junction.. Bony Palpation of the Right Hip: no tenderness of the greater trochanter and tenderness of the SI joint; Pelvis stable. Bony Palpation of the Left Hip: no tenderness of the greater trochanter and tenderness of the SI joint. Soft Tissue Palpation on the Right: No flank pain with percussion. Active Range of Motion: limited flexion and extention. ? ?Motor Strength: L1 Motor Strength on the Right: hip flexion  iliopsoas 5/5. L1 Motor Strength on the Left: hip flexion iliopsoas 5/5. L2-L4 Motor Strength on the Right: knee extension quadriceps 5/5. L2-L4 Motor Strength on the Left: knee extension quadriceps 5/5. L5 Motor Strength on the Right: ankle dorsiflexion tibialis anterior 5/5 and great toe extension extensor hallucis longus 5/5. L5 Motor Strength on the Left: ankle dorsiflexion tibialis  anterior 5/5 and great toe extension extensor hallucis longus 5/5. S1 Motor Strength on the Right: plantar flexion gastrocnemius 5/5. S1 Motor Strength on the Left: plantar flexion gastrocnemius 5/5. ? ?Neurological System: Knee Reflex Right: normal (2). Knee Reflex Left: normal (2). Ankle Reflex Right: diminished (1). Ankle Reflex Left: normal (2). Babinski Reflex Right: plantar reflex absent. Babinski Reflex Left: plantar reflex absent. Sensation on the Right: normal distal extremities. Sensation on the Left: normal distal extremities. Special Tests on the Right: no clonus of the ankle/knee and seated straight leg raising test positive. Special Tests on the Left: no clonus of the ankle/knee. ? ?Diminished repetitive plantarflexion. Decreased Achilles on the right  ?Skin: ?   General: Skin is warm and dry.  ?Neurological:  ?   Mental Status: She is alert.  ?  ?MRI demonstrates disc herniation L5-S1 to the right displacing the S1 nerve root ? ?Assessment/Plan ?Impression: ? 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 ?Tobacco dependence ? ?Plan: ? ? ?We discussed options she cannot live with her symptoms as they are. We discussed discectomy ? ?I had an extensive discussion with the patient concerning the pathology relevant anatomy and treatment options. At this point exhausting conservative treatment and in the presence of a neurologic deficit we discussed microlumbar decompression. I discussed the risks and benefits including bleeding, infection, DVT, PE, anesthetic complications, worsening in their symptoms, improvement in their symptoms, C SF leakage, epidural fibrosis, need for future surgeries such as revision discectomy and lumbar fusion. I also indicated that this is an operation to basically decompress the nerve roots to allow recovery as opposed to fixing a herniated disc if it is encountered and that the incidence  of recurrent chest disc herniation can approach 15%. Also that nerve root recovery is variable and may not recover completely. Any ligament or bone that is contributing to compressing the nerves will be removed as well. ? ?I discussed the operative course including overnight in the hospital. Immediate ambulation. Follow-up in 2 weeks for suture removal. 6 weeks until healing of the herniation and surgical incision followed by 6 weeks of reconditioning and strengthening of the core musculature. Also discussed the need to employ the concepts of disc pressure management and core motion following the surgery to minimize the risk of recurrent disc herniation. We will obtain preoperative clearance i if necessary and proceed accordingly. ? ?We discussed the necessity to commit to tobacco to cessation prior to surgery otherwise increased risk of epidural fibrosis scar tissue infection recurrent disc herniation etc. she indicated that she would ? ?We will utilize Ativan as needed for that purpose ? ? ?No history of DVT or MRSA ? ?Plan microdiscectomy L5-S1 right ? ?Dorothy Spark, PA-C for Dr Shelle Iron ?05/30/2021, 11:41 AM ? ? ? ?

## 2021-05-30 NOTE — Brief Op Note (Signed)
05/30/2021 ? ?1:13 PM ? ?PATIENT:  Brytney Ganger  41 y.o. female ? ?PRE-OPERATIVE DIAGNOSIS:  Herniated disc L5-S1 Right ? ?POST-OPERATIVE DIAGNOSIS:  * No post-op diagnosis entered * ? ?PROCEDURE:  Procedure(s) with comments: ?Microdiscectomy L5-S1 Right (Right) - 2 hrs ?3 C-Bed ? ?SURGEON:  Surgeon(s) and Role: ?   Susa Day, MD - Primary ? ?PHYSICIAN ASSISTANT:  ? ?ASSISTANTS: [place]  ? ?ANESTHESIA:   general ? ?EBL:  min  ? ?BLOOD ADMINISTERED:none ? ?DRAINS: none  ? ?LOCAL MEDICATIONS USED:  MARCAINE    ? ?SPECIMEN:  No Specimen ? ?DISPOSITION OF SPECIMEN:  N/A ? ?COUNTS:  YES ? ?TOURNIQUET:  * No tourniquets in log * ? ?DICTATION: .Other Dictation: Dictation Number [id number] ? ?PLAN OF CARE: Admit for overnight observation ? ?PATIENT DISPOSITION:  PACU - hemodynamically stable. ?  ?Delay start of Pharmacological VTE agent (>24hrs) due to surgical blood loss or risk of bleeding: yes ? ?

## 2021-05-30 NOTE — Progress Notes (Signed)
Patient awaiting transport to her vehicle for discharge home; in no acute distress nor complaints of pain nor discomfort; moves all extremities well; incision on her back with Aquacel dressing and is clean, dry and intact; room was checked for all her belongings; discharge instructions concerning her medications, incision care, follow up appointment and when to call the doctor as needed were all discussed to patient by RN and she expressed understanding on the instructions given.  ?

## 2021-05-30 NOTE — Transfer of Care (Signed)
Immediate Anesthesia Transfer of Care Note ? ?Patient: Andrea Singleton ? ?Procedure(s) Performed: Microdiscectomy L5-S1 Right (Right) ? ?Patient Location: PACU ? ?Anesthesia Type:General ? ?Level of Consciousness: awake, alert  and oriented ? ?Airway & Oxygen Therapy: Patient Spontanous Breathing and Patient connected to nasal cannula oxygen ? ?Post-op Assessment: Report given to RN and Post -op Vital signs reviewed and stable ? ?Post vital signs: Reviewed and stable ? ?Last Vitals:  ?Vitals Value Taken Time  ?BP 104/36 05/30/21 1557  ?Temp    ?Pulse 86 05/30/21 1558  ?Resp 14 05/30/21 1558  ?SpO2 100 % 05/30/21 1558  ?Vitals shown include unvalidated device data. ? ?Last Pain:  ?Vitals:  ? 05/30/21 1104  ?TempSrc: Oral  ?PainSc: 7   ?   ? ?Patients Stated Pain Goal: 5 (05/30/21 1104) ? ?Complications: No notable events documented. ?

## 2021-05-30 NOTE — Anesthesia Postprocedure Evaluation (Signed)
Anesthesia Post Note ? ?Patient: Andrea Singleton ? ?Procedure(s) Performed: Microdiscectomy L5-S1 Right (Right) ? ?  ? ?Patient location during evaluation: PACU ?Anesthesia Type: General ?Level of consciousness: awake ?Pain management: pain level controlled ?Vital Signs Assessment: post-procedure vital signs reviewed and stable ?Respiratory status: spontaneous breathing ?Cardiovascular status: stable ?Postop Assessment: no apparent nausea or vomiting ?Anesthetic complications: no ? ? ?No notable events documented. ? ?Last Vitals:  ?Vitals:  ? 05/30/21 1700 05/30/21 1722  ?BP: (!) 105/47 97/75  ?Pulse: (!) 59 83  ?Resp: (!) 21 18  ?Temp:  36.6 ?C  ?SpO2: 99% 100%  ?  ?Last Pain:  ?Vitals:  ? 05/30/21 1722  ?TempSrc:   ?PainSc: 2   ? ? ?  ?  ?  ?  ?  ?  ? ?China Deitrick ? ? ? ? ?

## 2021-05-30 NOTE — Anesthesia Procedure Notes (Signed)
Procedure Name: Intubation ?Date/Time: 05/30/2021 1:34 PM ?Performed by: Maude Leriche, CRNA ?Pre-anesthesia Checklist: Patient identified, Emergency Drugs available, Suction available and Patient being monitored ?Patient Re-evaluated:Patient Re-evaluated prior to induction ?Oxygen Delivery Method: Circle system utilized ?Preoxygenation: Pre-oxygenation with 100% oxygen ?Induction Type: IV induction ?Ventilation: Mask ventilation without difficulty ?Laryngoscope Size: Sabra Heck and 2 ?Grade View: Grade I ?Tube type: Oral ?Tube size: 6.5 mm ?Number of attempts: 1 ?Airway Equipment and Method: Stylet and Bite block ?Placement Confirmation: ETT inserted through vocal cords under direct vision, positive ETCO2 and breath sounds checked- equal and bilateral ?Secured at: 21 cm ?Tube secured with: Tape ?Dental Injury: Teeth and Oropharynx as per pre-operative assessment  ? ? ? ? ?

## 2021-05-30 NOTE — H&P (View-Only) (Signed)
Andrea Singleton is an 41 y.o. female.   ?Chief Complaint: back and R leg pain ?HPI: Reason for Visit: (normal) visit for: (back); 2 1/2 months ?Location (Lower Extremity): lower back pain on the right; leg pain on the right, , ?Severity: pain level 6/10 ?Quality: aching; stabbing ?Aggravating Factors: walking for ; twisting/turning ?Alleviating Factors: lying down helps ?Associated Symptoms: tingling right calf and foot ?Medications: Gabapentin 300mg  bid or tid ?Notes: ?04/18/21 S1 SNRB No relief ? ?Past Medical History:  ?Diagnosis Date  ? Anxiety   ? Bipolar disorder (HCC)   ? Depression   ? Dysrhythmia   ? SVT s/p ablation 2004  ? GERD (gastroesophageal reflux disease)   ? Hyperthyroidism   ? ? ?Past Surgical History:  ?Procedure Laterality Date  ? ABDOMINAL HYSTERECTOMY    ? KNEE ARTHROSCOPY Left   ? WRIST SURGERY Right   ? ? ?No family history on file. ?Social History:  reports that she has been smoking cigarettes. She has been smoking an average of .5 packs per day. She has never used smokeless tobacco. She reports that she does not currently use alcohol. She reports current drug use. Drug: Marijuana. ? ?Allergies:  ?Allergies  ?Allergen Reactions  ? Penicillins Other (See Comments)  ?  Allergy Test  ? ?Current meds: ?ARIPiprazole 20 mg tablet ?Ativan 0.5 mg tablet ?cholecalciferol (vitamin D3) 1,250 mcg (50,000 unit) capsule ?EPINEPHrine 0.3 mg/0.3 mL injection, auto-injector ?gabapentin 300 mg capsule ?methIMAzole 5 mg tablet ?simvastatin ? ?Review of Systems  ?Constitutional: Negative.   ?HENT: Negative.    ?Eyes: Negative.   ?Respiratory: Negative.    ?Cardiovascular: Negative.   ?Gastrointestinal: Negative.   ?Endocrine: Negative.   ?Genitourinary: Negative.   ?Musculoskeletal:  Positive for back pain and gait problem.  ?Skin: Negative.   ?Neurological:  Positive for weakness and numbness.  ?Psychiatric/Behavioral: Negative.    ? ?There were no vitals taken for this visit. ?Physical  Exam ?Constitutional:   ?   Appearance: Normal appearance.  ?HENT:  ?   Head: Normocephalic and atraumatic.  ?   Right Ear: External ear normal.  ?   Left Ear: External ear normal.  ?   Nose: Nose normal.  ?   Mouth/Throat:  ?   Pharynx: Oropharynx is clear.  ?Eyes:  ?   Conjunctiva/sclera: Conjunctivae normal.  ?Cardiovascular:  ?   Rate and Rhythm: Normal rate and regular rhythm.  ?   Pulses: Normal pulses.  ?Pulmonary:  ?   Effort: Pulmonary effort is normal.  ?Abdominal:  ?   General: Bowel sounds are normal.  ?Musculoskeletal:  ?   Cervical back: Normal range of motion.  ?   Comments: Gait and Station: Appearance: ambulating with no assistive devices and antalgic gait. ? ?Constitutional: General Appearance: healthy-appearing and distress (mild). ? ?Psychiatric: Mood and Affect: active and alert. ? ?Cardiovascular System: Edema Right: none; Dorsalis and posterior tibial pulses 2+. Edema Left: none. ? ?Abdomen: Inspection and Palpation: non-distended and no tenderness. ? ?Skin: Inspection and palpation: no rash. ? ?Lumbar Spine: Inspection: normal alignment. Bony Palpation of the Lumbar Spine: tender at lumbosacral junction.. Bony Palpation of the Right Hip: no tenderness of the greater trochanter and tenderness of the SI joint; Pelvis stable. Bony Palpation of the Left Hip: no tenderness of the greater trochanter and tenderness of the SI joint. Soft Tissue Palpation on the Right: No flank pain with percussion. Active Range of Motion: limited flexion and extention. ? ?Motor Strength: L1 Motor Strength on the Right: hip flexion  iliopsoas 5/5. L1 Motor Strength on the Left: hip flexion iliopsoas 5/5. L2-L4 Motor Strength on the Right: knee extension quadriceps 5/5. L2-L4 Motor Strength on the Left: knee extension quadriceps 5/5. L5 Motor Strength on the Right: ankle dorsiflexion tibialis anterior 5/5 and great toe extension extensor hallucis longus 5/5. L5 Motor Strength on the Left: ankle dorsiflexion tibialis  anterior 5/5 and great toe extension extensor hallucis longus 5/5. S1 Motor Strength on the Right: plantar flexion gastrocnemius 5/5. S1 Motor Strength on the Left: plantar flexion gastrocnemius 5/5. ? ?Neurological System: Knee Reflex Right: normal (2). Knee Reflex Left: normal (2). Ankle Reflex Right: diminished (1). Ankle Reflex Left: normal (2). Babinski Reflex Right: plantar reflex absent. Babinski Reflex Left: plantar reflex absent. Sensation on the Right: normal distal extremities. Sensation on the Left: normal distal extremities. Special Tests on the Right: no clonus of the ankle/knee and seated straight leg raising test positive. Special Tests on the Left: no clonus of the ankle/knee. ? ?Diminished repetitive plantarflexion. Decreased Achilles on the right  ?Skin: ?   General: Skin is warm and dry.  ?Neurological:  ?   Mental Status: She is alert.  ?  ?MRI demonstrates disc herniation L5-S1 to the right displacing the S1 nerve root ? ?Assessment/Plan ?Impression: ? 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 ?Tobacco dependence ? ?Plan: ? ? ?We discussed options she cannot live with her symptoms as they are. We discussed discectomy ? ?I had an extensive discussion with the patient concerning the pathology relevant anatomy and treatment options. At this point exhausting conservative treatment and in the presence of a neurologic deficit we discussed microlumbar decompression. I discussed the risks and benefits including bleeding, infection, DVT, PE, anesthetic complications, worsening in their symptoms, improvement in their symptoms, C SF leakage, epidural fibrosis, need for future surgeries such as revision discectomy and lumbar fusion. I also indicated that this is an operation to basically decompress the nerve roots to allow recovery as opposed to fixing a herniated disc if it is encountered and that the incidence  of recurrent chest disc herniation can approach 15%. Also that nerve root recovery is variable and may not recover completely. Any ligament or bone that is contributing to compressing the nerves will be removed as well. ? ?I discussed the operative course including overnight in the hospital. Immediate ambulation. Follow-up in 2 weeks for suture removal. 6 weeks until healing of the herniation and surgical incision followed by 6 weeks of reconditioning and strengthening of the core musculature. Also discussed the need to employ the concepts of disc pressure management and core motion following the surgery to minimize the risk of recurrent disc herniation. We will obtain preoperative clearance i if necessary and proceed accordingly. ? ?We discussed the necessity to commit to tobacco to cessation prior to surgery otherwise increased risk of epidural fibrosis scar tissue infection recurrent disc herniation etc. she indicated that she would ? ?We will utilize Ativan as needed for that purpose ? ? ?No history of DVT or MRSA ? ?Plan microdiscectomy L5-S1 right ? ?Dorothy Spark, PA-C for Dr Shelle Iron ?05/30/2021, 11:41 AM ? ? ? ?

## 2021-05-30 NOTE — Plan of Care (Signed)

## 2021-05-30 NOTE — Interval H&P Note (Signed)
History and Physical Interval Note: ? ?05/30/2021 ?1:16 PM ? ?Andrea Singleton  has presented today for surgery, with the diagnosis of Herniated disc L5-S1 Right.  The various methods of treatment have been discussed with the patient and family. After consideration of risks, benefits and other options for treatment, the patient has consented to  Procedure(s) with comments: ?Microdiscectomy L5-S1 Right (Right) - 2 hrs ?3 C-Bed as a surgical intervention.  The patient's history has been reviewed, patient examined, no change in status, stable for surgery.  I have reviewed the patient's chart and labs.  Questions were answered to the patient's satisfaction.   ? ? ?Javier Docker ? ? ?

## 2021-05-30 NOTE — Discharge Instructions (Addendum)

## 2021-05-31 ENCOUNTER — Encounter (HOSPITAL_COMMUNITY): Payer: Self-pay | Admitting: Specialist

## 2021-05-31 NOTE — Op Note (Signed)
NAME: Andrea Singleton, Andrea Singleton ?MEDICAL RECORD NO: [id number] ?ACCOUNT NO: [id number] ?DATE OF BIRTH: [birth year] ?FACILITY: [facility] ?LOCATION: [facility] ?PHYSICIAN: Javier Docker, MD ? ?Operative Report  ? ?DATE OF PROCEDURE: 05/30/2021 ? ?PREOPERATIVE DIAGNOSIS:  Spinal stenosis, herniated nucleus pulposus, L5-S1, right. ? ?POSTOPERATIVE DIAGNOSES:  Spinal stenosis, herniated nucleus pulposus, L5-S1, right. ? ?PROCEDURE PERFORMED:  Microlumbar decompression, L5-S1, right. ? ?ANESTHESIA:  General. ? ?ASSISTANT:  Andrez Grime, PA. ? ?Technical difficulty increased due to the patient's scoliosis, her kyphosis and her facet tropism. ? ?HISTORY:  A 41 year old with right lower extremity radicular pain, L5-S1 nerve root distribution secondary to disk herniation at L5-S1.  She had an increased lumbosacral angle and slight scoliosis with shingling of L5-S1.  She was indicated for  ?microlumbar decompression, L5-S1.  Risks and benefits discussed including bleeding, infection, damage to neurovascular structures, no change in symptoms, worsening symptoms, DVT, PE, anesthetic complications, etc. ? ?DESCRIPTION OF PROCEDURE:  With the patient in supine position, after induction of adequate general anesthesia, 2 grams Kefzol, she was placed prone on the Wilson frame.  All bony prominences were well padded.  Lumbar region was prepped and draped in the ? usual sterile fashion.  Two 18-gauge spinal needle was utilized to localize L5-S1 interspace, confirmed with x-ray.  Incision was made from the spinous process of L5-S1.  Subcutaneous tissue was dissected.  Electrocautery was utilized to achieve  ?hemostasis.  Dorsal lumbar fascia divided in line with skin incision.  Paraspinous muscle elevated from lamina 5 and S1.  McCulloch retractor was placed.  Operating microscope was draped and brought in the surgical field.  A Penfield #4 was utilized to  ?localize the L5-S1 space.  Again, this was shingled and therefore localization was challenging.   There was a small interlaminar window noted.  I performed a partial medial hemifacetectomy by utilizing a micro osteotome to remove less than 50% of the  ?inferior process.  I then used a micro curette to detach ligamentum flavum from the cephalad edge of S1.  I performed hemilaminotomy of the caudad edge of L5.  A Penfield was then utilized to develop a plane between the facet and the S1 nerve root.  I  ?used this to perform a foraminotomy at S1 and I removed ligamentum flavum from the interspace.  The nerve had been compressed into the lateral recess against the facet.  Protecting the nerve, I decompressed the lateral recess to the medial border of the  ?pedicle.  There was an epidural venous plexus noted.  This was cauterized and divided.  Focal HNP was noted.  I performed an annulotomy and copious portion of disk material was removed from the disk space with a micropituitary.  We further mobilized it  ?with Epstein and upbiting micropituitary.  Multiple fragments were excised.  Following this, I irrigated with catheter lavage, distal fragments were removed.  Following this, there was a satisfactory decompression of the S1 nerve root.  The [place]  ?retractor passed freely out the foramen of S1 and L5.  There was 1 cm of excursion of the S1 nerve root meeting the pedicle without tension.  No evidence of CSF leakage or active bleeding.  Confirmatory radiograph obtained.  The nerve root was  ?erythematous and edematous, but intact.  Felt this decompressed the S1 nerve root satisfactorily. ? ?Following this, I removed the [facility] retractor.  Paraspinous muscles inspected.  No evidence of active bleeding.  I irrigated the paraspinous musculature and closed the dorsal lumbar fascia with #1 Vicryl in interrupted  figure-of-eight sutures,  ?subcutaneous with 2-0 and skin with Prolene.  Sterile dressing applied.  Placed supine on the hospital bed, extubated without difficulty and transported to the recovery room in  satisfactory condition. ? ?The patient tolerated the procedure well.  No complications. ? ?ASSISTANT: Andrez Grime, PA, was used throughout the case for patient positioning, gentle intermittent neuro retraction, closure and irrigation. ? ?Minimal blood loss.  ? ? ? ? ?PAA ?D: 05/30/2021 3:48:25 pm T: 05/31/2021 2:08:00 am  ?JOB: [id number]/ [id number]  ?

## 2021-06-02 MED FILL — Thrombin For Soln Kit 20000 Unit: CUTANEOUS | Qty: 1 | Status: AC

## 2021-07-01 DIAGNOSIS — E0590 Thyrotoxicosis, unspecified without thyrotoxic crisis or storm: Secondary | ICD-10-CM | POA: Diagnosis not present

## 2021-07-01 DIAGNOSIS — D518 Other vitamin B12 deficiency anemias: Secondary | ICD-10-CM | POA: Diagnosis not present

## 2021-07-01 DIAGNOSIS — Z0000 Encounter for general adult medical examination without abnormal findings: Secondary | ICD-10-CM | POA: Diagnosis not present

## 2021-07-01 DIAGNOSIS — E785 Hyperlipidemia, unspecified: Secondary | ICD-10-CM | POA: Diagnosis not present

## 2021-07-23 DIAGNOSIS — Z91018 Allergy to other foods: Secondary | ICD-10-CM | POA: Diagnosis not present

## 2021-07-23 DIAGNOSIS — Z1231 Encounter for screening mammogram for malignant neoplasm of breast: Secondary | ICD-10-CM | POA: Diagnosis not present

## 2021-07-23 DIAGNOSIS — Z0000 Encounter for general adult medical examination without abnormal findings: Secondary | ICD-10-CM | POA: Diagnosis not present

## 2021-07-23 DIAGNOSIS — R9431 Abnormal electrocardiogram [ECG] [EKG]: Secondary | ICD-10-CM | POA: Diagnosis not present

## 2021-07-23 DIAGNOSIS — Z6826 Body mass index (BMI) 26.0-26.9, adult: Secondary | ICD-10-CM | POA: Diagnosis not present

## 2021-07-23 DIAGNOSIS — Z136 Encounter for screening for cardiovascular disorders: Secondary | ICD-10-CM | POA: Diagnosis not present

## 2021-08-13 DIAGNOSIS — R9431 Abnormal electrocardiogram [ECG] [EKG]: Secondary | ICD-10-CM | POA: Diagnosis not present

## 2021-08-13 DIAGNOSIS — E785 Hyperlipidemia, unspecified: Secondary | ICD-10-CM | POA: Diagnosis not present

## 2021-08-19 DIAGNOSIS — M79672 Pain in left foot: Secondary | ICD-10-CM | POA: Diagnosis not present

## 2021-08-19 DIAGNOSIS — M25562 Pain in left knee: Secondary | ICD-10-CM | POA: Diagnosis not present

## 2021-08-19 DIAGNOSIS — Z6826 Body mass index (BMI) 26.0-26.9, adult: Secondary | ICD-10-CM | POA: Diagnosis not present

## 2021-08-20 DIAGNOSIS — M25562 Pain in left knee: Secondary | ICD-10-CM | POA: Diagnosis not present

## 2021-08-20 DIAGNOSIS — M79672 Pain in left foot: Secondary | ICD-10-CM | POA: Diagnosis not present

## 2021-08-29 DIAGNOSIS — Z1231 Encounter for screening mammogram for malignant neoplasm of breast: Secondary | ICD-10-CM | POA: Diagnosis not present

## 2021-09-10 DIAGNOSIS — M79672 Pain in left foot: Secondary | ICD-10-CM | POA: Diagnosis not present

## 2021-09-10 DIAGNOSIS — M25562 Pain in left knee: Secondary | ICD-10-CM | POA: Diagnosis not present

## 2021-09-10 DIAGNOSIS — Z6826 Body mass index (BMI) 26.0-26.9, adult: Secondary | ICD-10-CM | POA: Diagnosis not present

## 2021-10-13 DIAGNOSIS — H5203 Hypermetropia, bilateral: Secondary | ICD-10-CM | POA: Diagnosis not present

## 2021-10-13 DIAGNOSIS — H40013 Open angle with borderline findings, low risk, bilateral: Secondary | ICD-10-CM | POA: Diagnosis not present

## 2021-10-13 DIAGNOSIS — H5213 Myopia, bilateral: Secondary | ICD-10-CM | POA: Diagnosis not present

## 2021-10-13 DIAGNOSIS — H524 Presbyopia: Secondary | ICD-10-CM | POA: Diagnosis not present

## 2021-10-13 DIAGNOSIS — H52223 Regular astigmatism, bilateral: Secondary | ICD-10-CM | POA: Diagnosis not present

## 2021-10-13 DIAGNOSIS — H47233 Glaucomatous optic atrophy, bilateral: Secondary | ICD-10-CM | POA: Diagnosis not present

## 2021-10-17 DIAGNOSIS — M25562 Pain in left knee: Secondary | ICD-10-CM | POA: Diagnosis not present

## 2021-10-17 DIAGNOSIS — M6752 Plica syndrome, left knee: Secondary | ICD-10-CM | POA: Diagnosis not present

## 2021-10-28 DIAGNOSIS — E559 Vitamin D deficiency, unspecified: Secondary | ICD-10-CM | POA: Diagnosis not present

## 2021-10-28 DIAGNOSIS — Z6826 Body mass index (BMI) 26.0-26.9, adult: Secondary | ICD-10-CM | POA: Diagnosis not present

## 2021-10-28 DIAGNOSIS — D518 Other vitamin B12 deficiency anemias: Secondary | ICD-10-CM | POA: Diagnosis not present

## 2021-10-28 DIAGNOSIS — Z1339 Encounter for screening examination for other mental health and behavioral disorders: Secondary | ICD-10-CM | POA: Diagnosis not present

## 2021-10-28 DIAGNOSIS — E0590 Thyrotoxicosis, unspecified without thyrotoxic crisis or storm: Secondary | ICD-10-CM | POA: Diagnosis not present

## 2021-10-28 DIAGNOSIS — F32A Depression, unspecified: Secondary | ICD-10-CM | POA: Diagnosis not present

## 2021-10-28 DIAGNOSIS — F419 Anxiety disorder, unspecified: Secondary | ICD-10-CM | POA: Diagnosis not present

## 2021-10-28 DIAGNOSIS — F319 Bipolar disorder, unspecified: Secondary | ICD-10-CM | POA: Diagnosis not present

## 2021-10-28 DIAGNOSIS — Z1331 Encounter for screening for depression: Secondary | ICD-10-CM | POA: Diagnosis not present

## 2021-10-28 DIAGNOSIS — E785 Hyperlipidemia, unspecified: Secondary | ICD-10-CM | POA: Diagnosis not present

## 2021-10-30 DIAGNOSIS — M25562 Pain in left knee: Secondary | ICD-10-CM | POA: Diagnosis not present

## 2021-11-06 DIAGNOSIS — D72829 Elevated white blood cell count, unspecified: Secondary | ICD-10-CM | POA: Diagnosis not present

## 2021-11-06 DIAGNOSIS — R799 Abnormal finding of blood chemistry, unspecified: Secondary | ICD-10-CM | POA: Diagnosis not present

## 2021-11-19 DIAGNOSIS — M25562 Pain in left knee: Secondary | ICD-10-CM | POA: Diagnosis not present

## 2021-11-21 ENCOUNTER — Ambulatory Visit (INDEPENDENT_AMBULATORY_CARE_PROVIDER_SITE_OTHER): Payer: Medicaid Other

## 2021-11-21 ENCOUNTER — Ambulatory Visit: Payer: Medicaid Other | Admitting: Podiatry

## 2021-11-21 DIAGNOSIS — M722 Plantar fascial fibromatosis: Secondary | ICD-10-CM | POA: Diagnosis not present

## 2021-11-21 DIAGNOSIS — M778 Other enthesopathies, not elsewhere classified: Secondary | ICD-10-CM | POA: Diagnosis not present

## 2021-11-21 MED ORDER — METHYLPREDNISOLONE 4 MG PO TBPK: ORAL_TABLET | ORAL | 0 refills | Status: DC

## 2021-11-21 MED ORDER — MELOXICAM 15 MG PO TABS: 15.0000 mg | ORAL_TABLET | Freq: Every day | ORAL | 0 refills | Status: DC

## 2021-11-21 NOTE — Patient Instructions (Signed)

## 2021-11-21 NOTE — Progress Notes (Unsigned)
  Subjective:  Patient ID: Andrea Singleton, female    DOB: 1980/05/20,  MRN: [id number]  Chief Complaint  Patient presents with   Foot Pain    Left foot pain arch radiating to lateral aspect of left foot. Constant pain. Patient is taking Tylenol as needed. 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     41 y.o. female presents with the above complaint. Patient with pain in the Left arch and radiating towards the left lateral foot/ankle area. Pt stands long periods and notes the pain is worse with extended time on her feet. Has not tried much yet for this issue.    Review of Systems: Negative except as noted in the HPI. Denies N/V/F/Ch.   Objective:  There were no vitals filed for this visit. There is no height or weight on file to calculate BMI. Constitutional Well developed. Well nourished.  Vascular Dorsalis pedis pulses palpable bilaterally. Posterior tibial pulses palpable bilaterally. Capillary refill normal to all digits.  No cyanosis or clubbing noted. Pedal hair growth normal.  Neurologic Normal speech. Oriented to person, place, and time. Epicritic sensation to light touch grossly present bilaterally.  Dermatologic Nails well groomed and normal in appearance. No open wounds. No skin lesions.  Orthopedic: Normal joint ROM without pain or crepitus bilaterally. No visible deformities. Tender to palpation at the calcaneal tuber left. Tender to palpation lateral foot at 5th met base and along peroneal tendons.  No pain with calcaneal squeeze left. Ankle ROM diminished range of motion bilaterally. Silfverskiold Test: negative bilaterally.   Radiographs: Taken and reviewed. No acute fractures or dislocations. No evidence of stress fracture.  Plantar heel spur absent. Posterior heel spur absent.   Assessment:   1. Plantar fasciitis, left   2. Capsulitis of foot, left    Plan:  Patient was evaluated and treated and all questions answered.  #  Plantar Fasciitis, left # Peroneal tendinitis - XR reviewed as above.  - Educated on icing and stretching. Instructions given.  - Injection offered but patient deferred - DME: recommend powerstep orthotics, pt deferred to later date - Pharmacologic management: Meloxicam 15 mg daily for 30 days Methylprednisolone 4 mg steroid taper pack take as directed for 6 days. Educated on risks/benefits and proper taking of medication.  Return in about 4 weeks (around 12/19/2021) for follow up left arch pain, tendinitis.

## 2021-11-26 DIAGNOSIS — F32A Depression, unspecified: Secondary | ICD-10-CM | POA: Diagnosis not present

## 2021-11-26 DIAGNOSIS — Z6826 Body mass index (BMI) 26.0-26.9, adult: Secondary | ICD-10-CM | POA: Diagnosis not present

## 2021-11-26 DIAGNOSIS — F319 Bipolar disorder, unspecified: Secondary | ICD-10-CM | POA: Diagnosis not present

## 2021-11-26 DIAGNOSIS — F419 Anxiety disorder, unspecified: Secondary | ICD-10-CM | POA: Diagnosis not present

## 2021-11-28 DIAGNOSIS — H47233 Glaucomatous optic atrophy, bilateral: Secondary | ICD-10-CM | POA: Diagnosis not present

## 2021-11-28 DIAGNOSIS — H40013 Open angle with borderline findings, low risk, bilateral: Secondary | ICD-10-CM | POA: Diagnosis not present

## 2021-12-09 DIAGNOSIS — N771 Vaginitis, vulvitis and vulvovaginitis in diseases classified elsewhere: Secondary | ICD-10-CM | POA: Diagnosis not present

## 2021-12-12 DIAGNOSIS — R051 Acute cough: Secondary | ICD-10-CM | POA: Diagnosis not present

## 2021-12-12 DIAGNOSIS — R070 Pain in throat: Secondary | ICD-10-CM | POA: Diagnosis not present

## 2021-12-23 ENCOUNTER — Ambulatory Visit: Payer: Medicaid Other | Admitting: Podiatry

## 2021-12-26 DIAGNOSIS — Z419 Encounter for procedure for purposes other than remedying health state, unspecified: Secondary | ICD-10-CM | POA: Diagnosis not present

## 2022-01-02 ENCOUNTER — Ambulatory Visit (INDEPENDENT_AMBULATORY_CARE_PROVIDER_SITE_OTHER): Payer: Medicaid Other | Admitting: Podiatry

## 2022-01-02 DIAGNOSIS — M7672 Peroneal tendinitis, left leg: Secondary | ICD-10-CM | POA: Diagnosis not present

## 2022-01-02 DIAGNOSIS — M7752 Other enthesopathy of left foot: Secondary | ICD-10-CM | POA: Diagnosis not present

## 2022-01-02 NOTE — Progress Notes (Signed)
  Subjective:  Patient ID: Andrea Singleton, female    DOB: July 28, 1980,  MRN: [id number]  Chief Complaint  Patient presents with   Follow-up    Patient complaining of left foot pain. Steroid was effective but pain returned after 2-3 weeks. Patient is not taking meloxicam due to not being effective. Patient administered medicaiton for approximately a month.     41 y.o. female presents for follow-up of left lateral foot and ankle pain.  She was previously diagnosed with plantar fasciitis on the left foot.  However she is having more pain on the outside of her foot and ankle area on the left side.  She had taken a steroid Dosepak for 6 days and says that the pain did decrease for about 2 to 3 weeks but has since returned.  She is also took meloxicam for some time and says it was effective while taking the steroid but after she stopped taking the steroid it was no longer effective so she stopped taking it.  Pain is in aching sharp stabbing pain on the outside of the left foot and ankle area.   Review of Systems: Negative except as noted in the HPI. Denies N/V/F/Ch.   Objective:  There were no vitals filed for this visit. There is no height or weight on file to calculate BMI. Constitutional Well developed. Well nourished.  Vascular Dorsalis pedis pulses palpable bilaterally. Posterior tibial pulses palpable bilaterally. Capillary refill normal to all digits.  No cyanosis or clubbing noted. Pedal hair growth normal.  Neurologic Normal speech. Oriented to person, place, and time. Epicritic sensation to light touch grossly present bilaterally.  Dermatologic Nails well groomed and normal in appearance. No open wounds. No skin lesions.  Orthopedic: Normal joint ROM without pain or crepitus bilaterally. No visible deformities. Tender to palpation lateral foot at 5th met base and along peroneal tendons.  No pain with calcaneal squeeze left. Ankle ROM diminished range of motion  bilaterally. Silfverskiold Test: negative bilaterally.   Radiographs: Taken last visit AP lateral oblique left foot Findings: Consistent with possible accessory os peroneum at the lateral aspect of the cuboid.  No fracture identified.  Assessment:   1. Tendinitis of left peroneus longus tendon   2. Peroneus brevis tendinitis, left   3. Os peroneum syndrome of left foot     Plan:  Patient was evaluated and treated and all questions answered.  # Peroneal tendinitis, possible os peroneum syndrome left foot -Discussed with the patient that I suspect either she has pain due to the os peroneum present in the left foot versus a split tear of the peroneus brevis or longus tendon near its insertion. -Recommend we proceed with an MRI of the left foot and ankle to evaluate the peroneal tendons for pathology as well as evaluate for any other lateral ankle and foot pathology that could be explaining her pain -Discussed that this may ultimately be a recommendation for surgical intervention if there is found to be peroneal tendon pathology.  May need to take out the os peroneum that is present there as well. -For the meantime as the patient is having pain with ambulation I recommend immobilization in a cam boot to the left lower extremity until next visit.  Cam boot was dispensed to the patient this visit.  We will hold off on steroid injection or further medication management at this time.  Return in about 4 weeks (around 01/30/2022) for follow up MRI results left.

## 2022-01-14 DIAGNOSIS — Z9889 Other specified postprocedural states: Secondary | ICD-10-CM | POA: Diagnosis not present

## 2022-01-14 DIAGNOSIS — M25562 Pain in left knee: Secondary | ICD-10-CM | POA: Diagnosis not present

## 2022-01-26 DIAGNOSIS — Z419 Encounter for procedure for purposes other than remedying health state, unspecified: Secondary | ICD-10-CM | POA: Diagnosis not present

## 2022-01-29 ENCOUNTER — Encounter: Payer: Self-pay | Admitting: Podiatry

## 2022-02-01 ENCOUNTER — Other Ambulatory Visit: Payer: Medicaid Other

## 2022-02-06 ENCOUNTER — Ambulatory Visit: Payer: Medicaid Other | Admitting: Podiatry

## 2022-02-10 ENCOUNTER — Other Ambulatory Visit (INDEPENDENT_AMBULATORY_CARE_PROVIDER_SITE_OTHER): Payer: Medicaid Other | Admitting: Podiatry

## 2022-02-10 DIAGNOSIS — M7672 Peroneal tendinitis, left leg: Secondary | ICD-10-CM

## 2022-02-10 DIAGNOSIS — M7752 Other enthesopathy of left foot: Secondary | ICD-10-CM

## 2022-02-10 NOTE — Progress Notes (Signed)
Rx for Physical therapy placed for peroneal tendinitis left lower extrem.

## 2022-02-12 ENCOUNTER — Telehealth: Payer: Self-pay | Admitting: Podiatry

## 2022-02-12 NOTE — Telephone Encounter (Signed)
Pt called and states that an order for MRI was entered about 6 weeks ago and the imaging center is still waiting on medical records for the prior authorization for Medicaid to cover the test. They have rescheduled pts appt due to not having these records.  Please advise.

## 2022-02-12 NOTE — Telephone Encounter (Signed)
Referral sent over for PT

## 2022-02-18 DIAGNOSIS — E559 Vitamin D deficiency, unspecified: Secondary | ICD-10-CM | POA: Diagnosis not present

## 2022-02-18 DIAGNOSIS — E785 Hyperlipidemia, unspecified: Secondary | ICD-10-CM | POA: Diagnosis not present

## 2022-02-18 DIAGNOSIS — Z20828 Contact with and (suspected) exposure to other viral communicable diseases: Secondary | ICD-10-CM | POA: Diagnosis not present

## 2022-02-18 DIAGNOSIS — Z13228 Encounter for screening for other metabolic disorders: Secondary | ICD-10-CM | POA: Diagnosis not present

## 2022-02-18 DIAGNOSIS — F419 Anxiety disorder, unspecified: Secondary | ICD-10-CM | POA: Diagnosis not present

## 2022-02-18 DIAGNOSIS — J019 Acute sinusitis, unspecified: Secondary | ICD-10-CM | POA: Diagnosis not present

## 2022-02-18 DIAGNOSIS — D518 Other vitamin B12 deficiency anemias: Secondary | ICD-10-CM | POA: Diagnosis not present

## 2022-02-18 DIAGNOSIS — E0590 Thyrotoxicosis, unspecified without thyrotoxic crisis or storm: Secondary | ICD-10-CM | POA: Diagnosis not present

## 2022-02-18 DIAGNOSIS — Z1321 Encounter for screening for nutritional disorder: Secondary | ICD-10-CM | POA: Diagnosis not present

## 2022-02-18 DIAGNOSIS — Z131 Encounter for screening for diabetes mellitus: Secondary | ICD-10-CM | POA: Diagnosis not present

## 2022-02-20 ENCOUNTER — Ambulatory Visit (INDEPENDENT_AMBULATORY_CARE_PROVIDER_SITE_OTHER): Payer: Medicaid Other | Admitting: Podiatry

## 2022-02-20 DIAGNOSIS — M7672 Peroneal tendinitis, left leg: Secondary | ICD-10-CM

## 2022-02-20 NOTE — Progress Notes (Signed)
  Subjective:  Patient ID: Andrea Singleton, female    DOB: 1980-05-09,  MRN: [id number]  Chief Complaint  Patient presents with   Foot Pain    Pt states that insurance denies MRI     42 y.o. female presents for follow-up of left lateral foot and ankle pain.  At last visit we discussed peroneal tendinitis and possible os peroneum syndrome.  Recommend an MRI to evaluate the peroneal tendons.  Patient's insurance denied the MRI request due to lack of physical therapy being attempted.  Patient is here to recheck the issue and discuss possible physical therapy so that we can proceed with MRI.   Review of Systems: Negative except as noted in the HPI. Denies N/V/F/Ch.   Objective:  There were no vitals filed for this visit. There is no height or weight on file to calculate BMI. Constitutional Well developed. Well nourished.  Vascular Dorsalis pedis pulses palpable bilaterally. Posterior tibial pulses palpable bilaterally. Capillary refill normal to all digits.  No cyanosis or clubbing noted. Pedal hair growth normal.  Neurologic Normal speech. Oriented to person, place, and time. Epicritic sensation to light touch grossly present bilaterally.  Dermatologic Nails well groomed and normal in appearance. No open wounds. No skin lesions.  Orthopedic: Normal joint ROM without pain or crepitus bilaterally. No visible deformities. Tender to palpation lateral foot at 5th met base and along peroneal tendons.  No pain with calcaneal squeeze left. Ankle ROM diminished range of motion bilaterally. Silfverskiold Test: negative bilaterally.   Radiographs: Taken last visit AP lateral oblique left foot Findings: Consistent with possible accessory os peroneum at the lateral aspect of the cuboid.  No fracture identified.  Assessment:   1. Tendinitis of left peroneus longus tendon   2. Peroneus brevis tendinitis, left     Plan:  Patient was evaluated and treated and all questions answered.  #  Peroneal tendinitis, possible os peroneum syndrome left foot -Discussed with the patient that I suspect either she has pain due to the os peroneum present in the left foot versus a split tear of the peroneus brevis or longus tendon near its insertion. -Patient was denied MRI per the insurance company.  Will proceed with 6 weeks of physical therapy hopefully to aid our case to order MRI to assess the peroneus longus and brevis tendons. -Order for physical therapy was placed at this visit -Discussed that this may ultimately be a recommendation for surgical intervention if there is found to be peroneal tendon pathology.  May need to take out the os peroneum that is present there as well. -Recommend use of ankle brace until next visit.  Patient will pick an ankle brace up at the medical supply store  Return in about 6 weeks (around 04/03/2022) for F/u after PT, L peroneal tendinitis.

## 2022-02-23 ENCOUNTER — Encounter: Payer: Self-pay | Admitting: Podiatry

## 2022-02-25 ENCOUNTER — Other Ambulatory Visit: Payer: Medicaid Other

## 2022-02-26 DIAGNOSIS — Z419 Encounter for procedure for purposes other than remedying health state, unspecified: Secondary | ICD-10-CM | POA: Diagnosis not present

## 2022-03-24 DIAGNOSIS — M7672 Peroneal tendinitis, left leg: Secondary | ICD-10-CM | POA: Diagnosis not present

## 2022-03-24 DIAGNOSIS — M25672 Stiffness of left ankle, not elsewhere classified: Secondary | ICD-10-CM | POA: Diagnosis not present

## 2022-03-24 DIAGNOSIS — R293 Abnormal posture: Secondary | ICD-10-CM | POA: Diagnosis not present

## 2022-03-24 DIAGNOSIS — M6281 Muscle weakness (generalized): Secondary | ICD-10-CM | POA: Diagnosis not present

## 2022-03-24 DIAGNOSIS — M25551 Pain in right hip: Secondary | ICD-10-CM | POA: Diagnosis not present

## 2022-03-24 DIAGNOSIS — M25572 Pain in left ankle and joints of left foot: Secondary | ICD-10-CM | POA: Diagnosis not present

## 2022-03-24 DIAGNOSIS — R2689 Other abnormalities of gait and mobility: Secondary | ICD-10-CM | POA: Diagnosis not present

## 2022-03-27 DIAGNOSIS — Z419 Encounter for procedure for purposes other than remedying health state, unspecified: Secondary | ICD-10-CM | POA: Diagnosis not present

## 2022-04-01 DIAGNOSIS — R293 Abnormal posture: Secondary | ICD-10-CM | POA: Diagnosis not present

## 2022-04-01 DIAGNOSIS — M7672 Peroneal tendinitis, left leg: Secondary | ICD-10-CM | POA: Diagnosis not present

## 2022-04-01 DIAGNOSIS — M25551 Pain in right hip: Secondary | ICD-10-CM | POA: Diagnosis not present

## 2022-04-01 DIAGNOSIS — R2689 Other abnormalities of gait and mobility: Secondary | ICD-10-CM | POA: Diagnosis not present

## 2022-04-01 DIAGNOSIS — M25672 Stiffness of left ankle, not elsewhere classified: Secondary | ICD-10-CM | POA: Diagnosis not present

## 2022-04-01 DIAGNOSIS — M25572 Pain in left ankle and joints of left foot: Secondary | ICD-10-CM | POA: Diagnosis not present

## 2022-04-01 DIAGNOSIS — M6281 Muscle weakness (generalized): Secondary | ICD-10-CM | POA: Diagnosis not present

## 2022-04-03 ENCOUNTER — Ambulatory Visit (INDEPENDENT_AMBULATORY_CARE_PROVIDER_SITE_OTHER): Payer: Medicaid Other | Admitting: Podiatry

## 2022-04-03 DIAGNOSIS — Z91199 Patient's noncompliance with other medical treatment and regimen due to unspecified reason: Secondary | ICD-10-CM

## 2022-04-03 NOTE — Progress Notes (Signed)
Pt was a no show for apt, no charge 

## 2022-04-09 DIAGNOSIS — R293 Abnormal posture: Secondary | ICD-10-CM | POA: Diagnosis not present

## 2022-04-09 DIAGNOSIS — R2689 Other abnormalities of gait and mobility: Secondary | ICD-10-CM | POA: Diagnosis not present

## 2022-04-09 DIAGNOSIS — M25572 Pain in left ankle and joints of left foot: Secondary | ICD-10-CM | POA: Diagnosis not present

## 2022-04-09 DIAGNOSIS — M25672 Stiffness of left ankle, not elsewhere classified: Secondary | ICD-10-CM | POA: Diagnosis not present

## 2022-04-09 DIAGNOSIS — M6281 Muscle weakness (generalized): Secondary | ICD-10-CM | POA: Diagnosis not present

## 2022-04-09 DIAGNOSIS — M25551 Pain in right hip: Secondary | ICD-10-CM | POA: Diagnosis not present

## 2022-04-09 DIAGNOSIS — M7672 Peroneal tendinitis, left leg: Secondary | ICD-10-CM | POA: Diagnosis not present

## 2022-04-17 DIAGNOSIS — R293 Abnormal posture: Secondary | ICD-10-CM | POA: Diagnosis not present

## 2022-04-17 DIAGNOSIS — M25551 Pain in right hip: Secondary | ICD-10-CM | POA: Diagnosis not present

## 2022-04-17 DIAGNOSIS — M25672 Stiffness of left ankle, not elsewhere classified: Secondary | ICD-10-CM | POA: Diagnosis not present

## 2022-04-17 DIAGNOSIS — R2689 Other abnormalities of gait and mobility: Secondary | ICD-10-CM | POA: Diagnosis not present

## 2022-04-17 DIAGNOSIS — M25572 Pain in left ankle and joints of left foot: Secondary | ICD-10-CM | POA: Diagnosis not present

## 2022-04-17 DIAGNOSIS — M7672 Peroneal tendinitis, left leg: Secondary | ICD-10-CM | POA: Diagnosis not present

## 2022-04-17 DIAGNOSIS — M6281 Muscle weakness (generalized): Secondary | ICD-10-CM | POA: Diagnosis not present

## 2022-04-22 DIAGNOSIS — M7672 Peroneal tendinitis, left leg: Secondary | ICD-10-CM | POA: Diagnosis not present

## 2022-04-22 DIAGNOSIS — M25551 Pain in right hip: Secondary | ICD-10-CM | POA: Diagnosis not present

## 2022-04-22 DIAGNOSIS — M25572 Pain in left ankle and joints of left foot: Secondary | ICD-10-CM | POA: Diagnosis not present

## 2022-04-22 DIAGNOSIS — R2689 Other abnormalities of gait and mobility: Secondary | ICD-10-CM | POA: Diagnosis not present

## 2022-04-22 DIAGNOSIS — M6281 Muscle weakness (generalized): Secondary | ICD-10-CM | POA: Diagnosis not present

## 2022-04-22 DIAGNOSIS — R293 Abnormal posture: Secondary | ICD-10-CM | POA: Diagnosis not present

## 2022-04-22 DIAGNOSIS — M25672 Stiffness of left ankle, not elsewhere classified: Secondary | ICD-10-CM | POA: Diagnosis not present

## 2022-04-27 DIAGNOSIS — Z419 Encounter for procedure for purposes other than remedying health state, unspecified: Secondary | ICD-10-CM | POA: Diagnosis not present

## 2022-05-06 DIAGNOSIS — Z13228 Encounter for screening for other metabolic disorders: Secondary | ICD-10-CM | POA: Diagnosis not present

## 2022-05-06 DIAGNOSIS — R002 Palpitations: Secondary | ICD-10-CM | POA: Diagnosis not present

## 2022-05-06 DIAGNOSIS — R9431 Abnormal electrocardiogram [ECG] [EKG]: Secondary | ICD-10-CM | POA: Diagnosis not present

## 2022-05-12 ENCOUNTER — Encounter: Payer: Self-pay | Admitting: Cardiology

## 2022-05-12 ENCOUNTER — Encounter: Payer: Self-pay | Admitting: *Deleted

## 2022-05-21 DIAGNOSIS — E0590 Thyrotoxicosis, unspecified without thyrotoxic crisis or storm: Secondary | ICD-10-CM | POA: Diagnosis not present

## 2022-05-21 DIAGNOSIS — R002 Palpitations: Secondary | ICD-10-CM | POA: Diagnosis not present

## 2022-05-21 DIAGNOSIS — M47812 Spondylosis without myelopathy or radiculopathy, cervical region: Secondary | ICD-10-CM | POA: Diagnosis not present

## 2022-05-21 DIAGNOSIS — R202 Paresthesia of skin: Secondary | ICD-10-CM | POA: Diagnosis not present

## 2022-05-21 DIAGNOSIS — M25512 Pain in left shoulder: Secondary | ICD-10-CM | POA: Diagnosis not present

## 2022-05-21 DIAGNOSIS — R200 Anesthesia of skin: Secondary | ICD-10-CM | POA: Diagnosis not present

## 2022-05-21 DIAGNOSIS — M542 Cervicalgia: Secondary | ICD-10-CM | POA: Diagnosis not present

## 2022-05-22 ENCOUNTER — Other Ambulatory Visit: Payer: Self-pay

## 2022-05-22 ENCOUNTER — Emergency Department (HOSPITAL_COMMUNITY)
Admission: EM | Admit: 2022-05-22 | Discharge: 2022-05-22 | Disposition: A | Discharge status: Home / Self Care | Payer: Medicaid Other | Attending: Emergency Medicine | Admitting: Emergency Medicine

## 2022-05-22 ENCOUNTER — Emergency Department (HOSPITAL_COMMUNITY): Payer: Medicaid Other

## 2022-05-22 ENCOUNTER — Encounter (HOSPITAL_COMMUNITY): Payer: Self-pay

## 2022-05-22 DIAGNOSIS — E039 Hypothyroidism, unspecified: Secondary | ICD-10-CM | POA: Diagnosis not present

## 2022-05-22 DIAGNOSIS — R0602 Shortness of breath: Secondary | ICD-10-CM | POA: Insufficient documentation

## 2022-05-22 DIAGNOSIS — I499 Cardiac arrhythmia, unspecified: Secondary | ICD-10-CM | POA: Insufficient documentation

## 2022-05-22 DIAGNOSIS — I409 Acute myocarditis, unspecified: Secondary | ICD-10-CM | POA: Insufficient documentation

## 2022-05-22 DIAGNOSIS — I7100 Dissection of unspecified site of aorta: Secondary | ICD-10-CM | POA: Insufficient documentation

## 2022-05-22 DIAGNOSIS — R0789 Other chest pain: Secondary | ICD-10-CM | POA: Diagnosis not present

## 2022-05-22 DIAGNOSIS — R079 Chest pain, unspecified: Secondary | ICD-10-CM | POA: Diagnosis not present

## 2022-05-22 DIAGNOSIS — I2699 Other pulmonary embolism without acute cor pulmonale: Secondary | ICD-10-CM | POA: Insufficient documentation

## 2022-05-22 DIAGNOSIS — I318 Other specified diseases of pericardium: Secondary | ICD-10-CM | POA: Insufficient documentation

## 2022-05-22 LAB — BASIC METABOLIC PANEL
Anion gap: 8 (ref 5–15)
BUN: 9 mg/dL (ref 6–20)
CO2: 25 mmol/L (ref 22–32)
Calcium: 8.9 mg/dL (ref 8.9–10.3)
Chloride: 104 mmol/L (ref 98–111)
Creatinine, Ser: 0.97 mg/dL (ref 0.44–1.00)
GFR, Estimated: >60 mL/min (ref >60)
Glucose, Bld: 77 mg/dL (ref 70–99)
Potassium: 3.6 mmol/L (ref 3.5–5.1)
Sodium: 137 mmol/L (ref 135–145)

## 2022-05-22 LAB — CBC
HCT: 40.6 % (ref 36.0–46.0)
Hemoglobin: 13.6 g/dL (ref 12.0–15.0)
MCH: 30.6 pg (ref 26.0–34.0)
MCHC: 33.5 g/dL (ref 30.0–36.0)
MCV: 91.4 fL (ref 80.0–100.0)
Platelets: 298 10*3/uL (ref 150–400)
RBC: 4.44 MIL/uL (ref 3.87–5.11)
RDW: 13 % (ref 11.5–15.5)
WBC: 5.5 10*3/uL (ref 4.0–10.5)
nRBC: 0 % (ref 0.0–0.2)

## 2022-05-22 LAB — TROPONIN I (HIGH SENSITIVITY)
Troponin I (High Sensitivity): 4 ng/L (ref <18)
Troponin I (High Sensitivity): 4 ng/L (ref <18)

## 2022-05-22 LAB — TSH: TSH: 2.138 u[IU]/mL (ref 0.350–4.500)

## 2022-05-22 MED ORDER — ASPIRIN 81 MG PO CHEW
324.0000 mg | CHEWABLE_TABLET | Freq: Once | ORAL | Status: AC
  Administered 2022-05-22: 324 mg via ORAL
  Filled 2022-05-22: qty 4

## 2022-05-22 NOTE — ED Triage Notes (Signed)
Pt came in via pOV d/t Chest tightness/pressure the last 2 weeks. Has been seen by her PCP & worn a heart monitor, Hx of SVT, A/Ox4, denies SOB.

## 2022-05-22 NOTE — Discharge Instructions (Addendum)
Andrea Singleton  Thank you for allowing [place] to take care of you today.  You came to the Emergency Department today because of an episode of worsening chest pain while you are at work today, you have had these episodes off and on over the past several weeks to months, you are currently undergoing outpatient workup for SVT.  Here in the emergency department your heart rate remained normal, your heart number was normal twice, your blood counts and blood salts are also looking normal, your chest x-ray did not show any signs of collapsed lung, broken inside or out of place or pneumonia.  We rechecked your thyroid-stimulating hormone or TSH, and this is also normal which indicates that your medication dose is the right amount.  Overall it is unclear what exactly is causing your chest pain, however it is not one of the emergency causes that we were able to identify today.  We recommend continue to follow-up with your PCP and your cardiologist..   To-Do: 1. Please follow-up with your primary doctor within 2 days / as soon as possible.   Please return to the Emergency Department or call 911 if you experience have worsening of your symptoms, or do not get better, chest pain, shortness of breath, severe or significantly worsening pain, high fever, severe confusion, pass out or have any reason to think that you need emergency medical care.   We hope you feel better soon.   Curley Spice, MD Department of Emergency Medicine [facility]

## 2022-05-22 NOTE — ED Provider Notes (Signed)
[place] EMERGENCY DEPARTMENT AT [facility] Provider Note  Medical Decision Making   HPI: Andrea Singleton is a 42 y.o. female with history perinent anxiety, depression, bipolar disorder, SVT status post ablation, hypothyroidism, GERD, hyperlipidemia who presents complaining of chest tightness. Patient arrived via POV accompanied by daughter, Andrea Singleton. History provided by patient.  No interpreter required for this encounter.  Patient reports that she is concerned regarding chest pain.  Reports that she has had intermittent chest pain for approximately 2 weeks, however feels that she had an episode that was worse than baseline today.  Reports that it began at approximately 2 PM while she was at work, patient works as a Child psychotherapist, however symptoms again while at rest.  Patient describes pain as a chest tightness that radiates from her substernal area to the left side of her neck.  Denies radiation to arms or back.  Reports some associated shortness of breath, denies any diaphoresis, nausea, vomiting.  Denies any chest pressure.  Reports she is concerned because her mother died of a massive MI at 31.  Patient reports that she has recently redeveloped symptoms of SVT.  Was previously diagnosed with SVT, however underwent a successful ablation in 2004 with recent recurrence of symptoms over the past year.  Reports that she was recently wearing a heart monitor and received report yesterday that she had intermittent "skipped beats", and a heart rate that ranged from 48-1 37.  Reports that she is due to follow-up these results with cardiology in May.  Reports that symptoms of chest tightness (primarily with inhalation) are ongoing but significantly improved, no specific aggravating or alleviating factors.  Denies use of anticoagulant, recent travel, bedbound, hemoptysis, prior PE/DVT, hormone use.  ROS: As per HPI. Please see MAR for complete past medical history, surgical history, and social  history.   Physical exam is pertinent for no focal abnormalities.   The differential includes but is not limited to ACS, arrhythmia, pericardial tamponade, pericarditis, myocarditis, pneumonia, pneumothorax, esophageal, tear, perforated abdominal viscous, pulmonary embolism, aortic dissection, costochondritis, musculoskeletal chest wall pain, GERD, hyperthyroidism  Additional history obtained from: None External records from outside source obtained and reviewed including: Recent PCP and cardiology notes that patient reports that she has had are not available in chart review  ED provider interpretation of ECG: Rate 63, sinus rhythm, possible mild ST elevation in lead V2, no reciprocal depressions, nonspecific T wave inversion in lead aVL.  ED provider interpretation of radiology/imaging: Chest x-ray without focal airspace opacification, cardiomediastinal silhouette derangement, pleural effusion, pneumothorax, bony derangement.  Labs ordered were interpreted by myself as well as my attending and were incorporated into the medical decision making process for this patient.  ED provider interpretation of labs: CBC without leukocytosis, anemia, thrombocytopenia/thrombocytosis.  BMP without AKI or emergent electrolyte derangement.  TSH WNL.  Initial troponin 4, delta troponin 4.  Interventions: ASA    See the EMR for full details regarding lab and imaging results.   Ms. Baine is awake, alert, and HDS.  Her exam is most notable for lack of focal findings.  Aspirin has been given.  The ECG reveals no anatomical ischemia representing STEMI, New-Onset Arrhythmia, or ischemic equivalent.  She has been risk stratified with a HEAR score of 2. Initial troponin is 4; delta troponin is 4.  The patient's presentation, the patient being hemodynamically stable, and the ECG are not consistent with Pericardial Tamponade. The patient's pain is not positional. This in conjunction with the lack of PR  depressions  and ST elevations on the ECG are reassuring against Pericarditis. The patient's non-elevated troponin and ECG are also inconsistent with Myocarditis.  The CXR is unremarkable for focal airspace disease.  The patient is afebrile and reports that she has a baseline smoker's cough that has not recently changed in characteristic.  Therefore, I do not suspect Pneumonia. There is no evidence of Pneumothorax on physical exam or on the CXR. CXR shows no evidence of Esophageal Tear and there is no recent intractable emesis or esophageal instrumentation. There is no peritonitis or free air on CXR worrisome for a Perforated Abdominal Viscous.  I do not think that the patient has a Pulmonary Embolism. The patient is PERC negative (age < 50, HR < 100, SpO2% > 95%, no unilateral leg swelling, no hemoptysis, no surgery or trauma requiring anesthesia within the past 4 weeks, no history of prior PE or DVT, and no hormone use).   The patient's pain is not tearing and it does not radiate to back. Pulses are present bilaterally in both the upper and lower extremities. CXR does not show a widened mediastinum. I have a very low suspicion for Aortic Dissection.   Denies a history of hyperthyroidism and thyrotoxicosis, given recent reports of sensations of palpitations at home, TSH was checked, WNL, doubt thyroid abnormality contributing to patient's presentation at this time.  Overall, etiology of patient's presentation not fully elucidated by this visit, however patient at low risk for emergent causes of chest pain as per above workup, this was discussed with patient at the bedside, recommended continued follow-up with PCP and cardiology, patient was amenable to this plan.     Consults: Not indicated  Disposition: DISCHARGE: I believe that the patient is safe for discharge home with outpatient follow-up. Patient was informed of all pertinent physical exam, laboratory, and imaging findings. Patient's suspected  etiology of their symptom presentation was discussed with the patient and all questions were answered. We discussed following up with PCP and cardiology. I provided thorough ED return precautions. The patient feels safe and comfortable with this plan.  The plan for this patient was discussed with Dr. Anitra Lauth, who voiced agreement and who oversaw evaluation and treatment of this patient.  Clinical Impression:  1. Chest pain, unspecified type    Discharge  Therapies: These medications and interventions were provided for the patient while in the ED. Medications  aspirin chewable tablet 324 mg (324 mg Oral Given 05/22/22 1807)    MDM generated using voice dictation software and may contain dictation errors.  Please contact me for any clarification or with any questions.  Clinical Complexity A medically appropriate history, review of systems, and physical exam was performed.  Collateral history obtained from: None I personally reviewed the labs, EKG, imaging as discussed above. Patient's presentation is most consistent with acute complicated illness / injury requiring diagnostic workup Considered and ruled out life and body threatening conditions  Treatment: Outpatient follow-up Medications: OTC Discussed patient's care with providers from the following different specialties: None  Physical Exam   ED Triage Vitals  Enc Vitals Group     BP 05/22/22 1548 121/63     Pulse Rate 05/22/22 1548 76     Resp 05/22/22 1548 16     Temp 05/22/22 1548 98.2 F (36.8 C)     Temp Source 05/22/22 1548 Oral     SpO2 05/22/22 1548 100 %     Weight --      Height --      Head  Circumference --      Peak Flow --      Pain Score 05/22/22 1616 0     Pain Loc --      Pain Edu? --      Excl. in GC? --      Physical Exam Vitals and nursing note reviewed.  Constitutional:      General: She is not in acute distress.    Appearance: She is well-developed. She is obese.  HENT:     Head:  Normocephalic and atraumatic.  Eyes:     Conjunctiva/sclera: Conjunctivae normal.  Cardiovascular:     Rate and Rhythm: Normal rate and regular rhythm.     Heart sounds: No murmur heard. Pulmonary:     Effort: Pulmonary effort is normal. No respiratory distress.     Breath sounds: Normal breath sounds. No wheezing.  Abdominal:     Palpations: Abdomen is soft.     Tenderness: There is no abdominal tenderness.  Musculoskeletal:        General: No swelling or tenderness.     Cervical back: Neck supple.     Right lower leg: No edema.     Left lower leg: No edema.  Skin:    General: Skin is warm and dry.     Capillary Refill: Capillary refill takes less than 2 seconds.  Neurological:     General: No focal deficit present.     Mental Status: She is alert and oriented to person, place, and time.  Psychiatric:        Mood and Affect: Mood normal.       Procedure Note  Procedures  DG Chest 2 View  Final Result      Julianne Rice, MD Emergency Medicine, PGY-2   Curley Spice, MD 05/22/22 1610    Gwyneth Sprout, MD 05/23/22 [phone]

## 2022-05-27 DIAGNOSIS — Z419 Encounter for procedure for purposes other than remedying health state, unspecified: Secondary | ICD-10-CM | POA: Diagnosis not present

## 2022-06-01 DIAGNOSIS — D72829 Elevated white blood cell count, unspecified: Secondary | ICD-10-CM | POA: Insufficient documentation

## 2022-06-01 DIAGNOSIS — F32A Depression, unspecified: Secondary | ICD-10-CM | POA: Insufficient documentation

## 2022-06-01 DIAGNOSIS — E785 Hyperlipidemia, unspecified: Secondary | ICD-10-CM | POA: Insufficient documentation

## 2022-06-01 DIAGNOSIS — I499 Cardiac arrhythmia, unspecified: Secondary | ICD-10-CM | POA: Insufficient documentation

## 2022-06-01 DIAGNOSIS — F419 Anxiety disorder, unspecified: Secondary | ICD-10-CM | POA: Insufficient documentation

## 2022-06-01 DIAGNOSIS — E559 Vitamin D deficiency, unspecified: Secondary | ICD-10-CM | POA: Insufficient documentation

## 2022-06-01 DIAGNOSIS — E0590 Thyrotoxicosis, unspecified without thyrotoxic crisis or storm: Secondary | ICD-10-CM | POA: Insufficient documentation

## 2022-06-01 DIAGNOSIS — K219 Gastro-esophageal reflux disease without esophagitis: Secondary | ICD-10-CM | POA: Insufficient documentation

## 2022-06-01 DIAGNOSIS — F319 Bipolar disorder, unspecified: Secondary | ICD-10-CM | POA: Insufficient documentation

## 2022-06-03 DIAGNOSIS — R202 Paresthesia of skin: Secondary | ICD-10-CM | POA: Diagnosis not present

## 2022-06-03 DIAGNOSIS — M25512 Pain in left shoulder: Secondary | ICD-10-CM | POA: Diagnosis not present

## 2022-06-03 DIAGNOSIS — M542 Cervicalgia: Secondary | ICD-10-CM | POA: Diagnosis not present

## 2022-06-12 DIAGNOSIS — M542 Cervicalgia: Secondary | ICD-10-CM | POA: Diagnosis not present

## 2022-06-12 DIAGNOSIS — F419 Anxiety disorder, unspecified: Secondary | ICD-10-CM | POA: Diagnosis not present

## 2022-06-12 DIAGNOSIS — E0590 Thyrotoxicosis, unspecified without thyrotoxic crisis or storm: Secondary | ICD-10-CM | POA: Diagnosis not present

## 2022-06-12 DIAGNOSIS — F319 Bipolar disorder, unspecified: Secondary | ICD-10-CM | POA: Diagnosis not present

## 2022-06-12 DIAGNOSIS — R202 Paresthesia of skin: Secondary | ICD-10-CM | POA: Diagnosis not present

## 2022-06-12 DIAGNOSIS — M5441 Lumbago with sciatica, right side: Secondary | ICD-10-CM | POA: Diagnosis not present

## 2022-06-12 DIAGNOSIS — N760 Acute vaginitis: Secondary | ICD-10-CM | POA: Diagnosis not present

## 2022-06-15 NOTE — Progress Notes (Unsigned)
Cardiology Office Note:    Date:  06/16/2022   ID:  Andrea Singleton, DOB November 27, 1980, MRN [id number]  PCP:  [facility]  Cardiologist:  Norman Herrlich, MD   Referring MD: Lars Mage, NP  ASSESSMENT:    1. Chest pain of uncertain etiology   2. Hyperlipidemia, unspecified hyperlipidemia type   3. Status post catheter ablation of slow pathway   4. Palpitations    PLAN:    In order of problems listed above:  Very complex visit with multiple reasons for medical improvement.  For most of his chest pain multiple cardiovascular risk she needs evaluation after the ED visit we discussed modalities functional testing versus cardiac CTA will be set up for CTA as an outpatient also give [place] a calcium score to guide if she will need ongoing lipid-lowering therapy also will check an LP(a) For now continue statin I told her I will look at the results of the monitor and let her know if we need to do anything different Activate her Apple Watch she can catch symptomatic episodes throughout and can send them to me gives a medical quality rhythm strip Avoid over-the-counter proarrhythmic drugs  Next appointment 3 months   Medication Adjustments/Labs and Tests Ordered: Current medicines are reviewed at length with the patient today.  Concerns regarding medicines are outlined above.  No orders of the defined types were placed in this encounter.  No orders of the defined types were placed in this encounter.     Initially referred for palpitation subsequently seen in the emergency room and is here today for chest pain  History of Present Illness:    Andrea Singleton is a 42 y.o. female who is being seen today for the evaluation of palpitation at the request of Tetter, Devin B, NP.  Chart review shows that she has a history of hyperlipidemia on simvastatin and hypothyroidism on suppressant treatment as well as a background history of SVT and previous EP catheter ablation in  2004.Marland Kitchen  Office EKG 05/11/2022 showed sinus rhythm typical left bundle branch block QRS duration 120 ms and no finding of preexcitation  She was seen [facility] emergency room 05/22/2022 for chest tightness there is no demonstrable arrhythmia EKG was described as normal she had a high-sensitivity troponin that was very low BMP with potassium 3.6 GFR greater than 60 cc CBC normal hemoglobin 13.6 and chest x-ray was normal.  There is a notation she was advised to follow-up with cardiology afterwards.  I independently reviewed the EKG which shows sinus rhythm poor R wave progression and minor ST depression.  Is unchanged from an EKG April 2023.  She had an EKG at [facility] August 2022 showing normal left ventricular size wall thickness systolic function physiologic tricuspid regurgitation otherwise normal echocardiogram.  We recognize each other I do not have source records which she tells me she had 2 separate EP studies in 2004 with pathway ablation. Overall is done well she has had no severe or sustained arrhythmia She has had a sensation of skipping tells me she wore an event monitor about a month ago I do not have the results but was advised to see me in follow-up. Has not had syncope but at times is felt as if her heart has been rapid again She has chest pain at times exertion and is concerned with a family history of premature CAD cigarette smoking and hyperlipidemia on a statin No edema orthopnea. Past Medical History:  Diagnosis Date   Anxiety  Bipolar disorder (HCC)    Depression    Dysrhythmia    SVT s/p ablation 2004   Elevated white blood cell count, unspecified    GERD (gastroesophageal reflux disease)    Hyperlipidemia    Hyperthyroidism    Thyrotoxicosis    Vitamin D deficiency     Past Surgical History:  Procedure Laterality Date   ABDOMINAL HYSTERECTOMY  07/2006   ABLATION  2004   2 Catheter Ablations for SVT's, [facility]   KNEE ARTHROSCOPY Left     LUMBAR LAMINECTOMY/DECOMPRESSION MICRODISCECTOMY Right 05/30/2021   Procedure: Microdiscectomy L5-S1 Right;  Surgeon: Jene Every, MD;  Location: [facility] OR;  Service: Orthopedics;  Laterality: Right;  2 hrs 3 C-Bed   WRIST SURGERY Right 10/15/2020   1st Extensor Compartment Release    Current Medications: Current Meds  Medication Sig   ARIPiprazole (ABILIFY) 30 MG tablet Take 1 tablet by mouth daily.   buPROPion (ZYBAN) 150 MG 12 hr tablet Take 150 mg by mouth daily.   EPINEPHrine 0.3 mg/0.3 mL IJ SOAJ injection Inject 0.3 mg into the muscle as needed for anaphylaxis.   methimazole (TAPAZOLE) 5 MG tablet Take 5 mg by mouth daily.   metroNIDAZOLE (FLAGYL) 500 MG tablet Take 500 mg by mouth 2 (two) times daily.   omeprazole (PRILOSEC) 20 MG capsule Take 20 mg by mouth at bedtime.   simvastatin (ZOCOR) 20 MG tablet Take 20 mg by mouth at bedtime.     Allergies:   Penicillins   Social History   Socioeconomic History   Marital status: Single    Spouse name: Not on file   Number of children: Not on file   Years of education: Not on file   Highest education level: Not on file  Occupational History   Not on file  Tobacco Use   Smoking status: Every Day    Packs/day: .5    Types: Cigarettes   Smokeless tobacco: Never  Vaping Use   Vaping Use: Never used  Substance and Sexual Activity   Alcohol use: Not Currently   Drug use: Yes    Types: Marijuana   Sexual activity: Not on file  Other Topics Concern   Not on file  Social History Narrative   Not on file   Social Determinants of Health   Financial Resource Strain: Not on file  Food Insecurity: Not on file  Transportation Needs: Not on file  Physical Activity: Not on file  Stress: Not on file  Social Connections: Not on file     Family History: The patient's  family history includes Heart attack in her mother; Heart disease in her mother.  ROS:   ROS Please see the history of present illness.     All other systems  reviewed and are negative.  EKGs/Labs/Other Studies Reviewed:    The following studies were reviewed today:   Cardiac Studies & Procedures       ECHOCARDIOGRAM  ECHOCARDIOGRAM COMPLETE 11/05/2020             See history  Physical Exam:    VS:  BP 106/62 (BP Location: Left Arm, Patient Position: Sitting, Cuff Size: Normal)   Pulse 84   Ht 5' (1.524 m)   Wt 146 lb 6.4 oz (66.4 kg)   SpO2 98%   BMI 28.59 kg/m     Wt Readings from Last 3 Encounters:  06/16/22 146 lb 6.4 oz (66.4 kg)  05/22/22 143 lb (64.9 kg)  05/06/22 143 lb (64.9 kg)  GEN:  Well nourished, well developed in no acute distress HEENT: Normal NECK: No JVD; No carotid bruits LYMPHATICS: No lymphadenopathy CARDIAC: RRR, no murmurs, rubs, gallops RESPIRATORY:  Clear to auscultation without rales, wheezing or rhonchi  ABDOMEN: Soft, non-tender, non-distended MUSCULOSKELETAL:  No edema; No deformity  SKIN: Warm and dry NEUROLOGIC:  Alert and oriented x 3 PSYCHIATRIC:  Normal affect     Signed, Norman Herrlich, MD  06/16/2022 10:08 AM    [facility]

## 2022-06-16 ENCOUNTER — Encounter: Payer: Self-pay | Admitting: Cardiology

## 2022-06-16 ENCOUNTER — Ambulatory Visit: Payer: Medicaid Other | Attending: Cardiology | Admitting: Cardiology

## 2022-06-16 VITALS — BP 106/62 | HR 84 | Ht 60.0 in | Wt 146.4 lb

## 2022-06-16 DIAGNOSIS — R079 Chest pain, unspecified: Secondary | ICD-10-CM

## 2022-06-16 DIAGNOSIS — E785 Hyperlipidemia, unspecified: Secondary | ICD-10-CM | POA: Diagnosis not present

## 2022-06-16 DIAGNOSIS — Z8679 Personal history of other diseases of the circulatory system: Secondary | ICD-10-CM

## 2022-06-16 DIAGNOSIS — Z9889 Other specified postprocedural states: Secondary | ICD-10-CM

## 2022-06-16 DIAGNOSIS — R002 Palpitations: Secondary | ICD-10-CM | POA: Diagnosis not present

## 2022-06-16 DIAGNOSIS — R072 Precordial pain: Secondary | ICD-10-CM

## 2022-06-16 MED ORDER — METOPROLOL TARTRATE 100 MG PO TABS: 100.0000 mg | ORAL_TABLET | Freq: Once | ORAL | 0 refills | Status: DC

## 2022-06-16 NOTE — Patient Instructions (Signed)
Medication Instructions:  Your physician recommends that you continue on your current medications as directed. Please refer to the Current Medication list given to you today.  *If you need a refill on your cardiac medications before your next appointment, please call your pharmacy*   Lab Work: Your physician recommends that you return for lab work in:   Labs 1 week before CT: BMP  If you have labs (blood work) drawn today and your tests are completely normal, you will receive your results only by: MyChart Message (if you have MyChart) OR A paper copy in the mail If you have any lab test that is abnormal or we need to change your treatment, we will call you to review the results.   Testing/Procedures:   Your cardiac CT will be scheduled at one of the below locations:   [facility] [address] [place], [place] [address] [phone]  OR  [facility] [address] [place], [place] [address] [phone]  OR   [facility] [address] [place], [place] [address] [phone]  If scheduled at [facility], please arrive at the [facility] Entrance (Entrance C2) of [facility] 30 minutes prior to test start time. You can use the FREE valet parking offered at entrance C (encouraged to control the heart rate for the test)  Proceed to the [facility] Radiology Department (first floor) to check-in and test prep.  All radiology patients and guests should use entrance C2 at [facility], accessed from [facility], even though the hospital's physical address listed is [address]    If scheduled at [facility] or [facility], please arrive 15 mins early for check-in and test prep.   Please follow these instructions carefully (unless otherwise directed):  On the Night Before the Test: Be  sure to Drink plenty of water. Do not consume any caffeinated/decaffeinated beverages or chocolate 12 hours prior to your test. Do not take any antihistamines 12 hours prior to your test.  On the Day of the Test: Drink plenty of water until 1 hour prior to the test. Do not eat any food 1 hour prior to test. You may take your regular medications prior to the test.  Take metoprolol (Lopressor) two hours prior to test. FEMALES- please wear underwire-free bra if available, avoid dresses & tight clothing      After the Test: Drink plenty of water. After receiving IV contrast, you may experience a mild flushed feeling. This is normal. On occasion, you may experience a mild rash up to 24 hours after the test. This is not dangerous. If this occurs, you can take Benadryl 25 mg and increase your fluid intake. If you experience trouble breathing, this can be serious. If it is severe call 911 IMMEDIATELY. If it is mild, please call our office.  We will call to schedule your test 2-4 weeks out understanding that some insurance companies will need an authorization prior to the service being performed.   For non-scheduling related questions, please contact the cardiac imaging nurse navigator should you have any questions/concerns: Rockwell Alexandria, Cardiac Imaging Nurse Navigator Larey Brick, Cardiac Imaging Nurse Navigator [facility] Direct Office Dial: [phone]   For scheduling needs, including cancellations and rescheduling, please call [place], [phone].    Follow-Up: At [facility], you and your health needs are our priority.  As part  of our continuing mission to provide you with exceptional heart care, we have created designated Provider Care Teams.  These Care Teams include your primary Cardiologist (physician) and Advanced Practice Providers (APPs -  Physician Assistants and Nurse Practitioners) who all work together to provide you with the care  you need, when you need it.  We recommend signing up for the patient portal called "MyChart".  Sign up information is provided on this After Visit Summary.  MyChart is used to connect with patients for Virtual Visits (Telemedicine).  Patients are able to view lab/test results, encounter notes, upcoming appointments, etc.  Non-urgent messages can be sent to your provider as well.   To learn more about what you can do with MyChart, go to [email or web address].    Your next appointment:   3 month(s)  Provider:   Norman Herrlich, MD    Other Instructions Set-up Apple watch   1. Avoid all over-the-counter antihistamines except Claritin/Loratadine and Zyrtec/Cetrizine. 2. Avoid all combination including cold sinus allergies flu decongestant and sleep medications 3. You can use Robitussin DM Mucinex and Mucinex DM for cough. 4. can use Tylenol aspirin ibuprofen and naproxen but no combinations such as sleep or sinus.

## 2022-06-16 NOTE — Addendum Note (Signed)
Addended by: Roxanne Mins I on: 06/16/2022 10:32 AM   Modules accepted: Orders

## 2022-06-27 DIAGNOSIS — Z419 Encounter for procedure for purposes other than remedying health state, unspecified: Secondary | ICD-10-CM | POA: Diagnosis not present

## 2022-06-30 ENCOUNTER — Ambulatory Visit (HOSPITAL_COMMUNITY): Admission: RE | Admit: 2022-06-30 | Payer: Medicaid Other | Source: Ambulatory Visit

## 2022-07-07 ENCOUNTER — Ambulatory Visit (HOSPITAL_COMMUNITY): Payer: Medicaid Other

## 2022-07-14 ENCOUNTER — Ambulatory Visit (HOSPITAL_COMMUNITY): Payer: Medicaid Other

## 2022-07-14 IMAGING — DX DG LUMBAR SPINE 2-3V
2 series · 2 of 2 positions shown · non-contrast
Comparison: X-ray 03/18/2021; MRI 12/11/2009.

CLINICAL DATA: Preoperative lumbar surgery.

EXAM:
LUMBAR SPINE - 2-3 VIEW

[w lumbar spine ap]
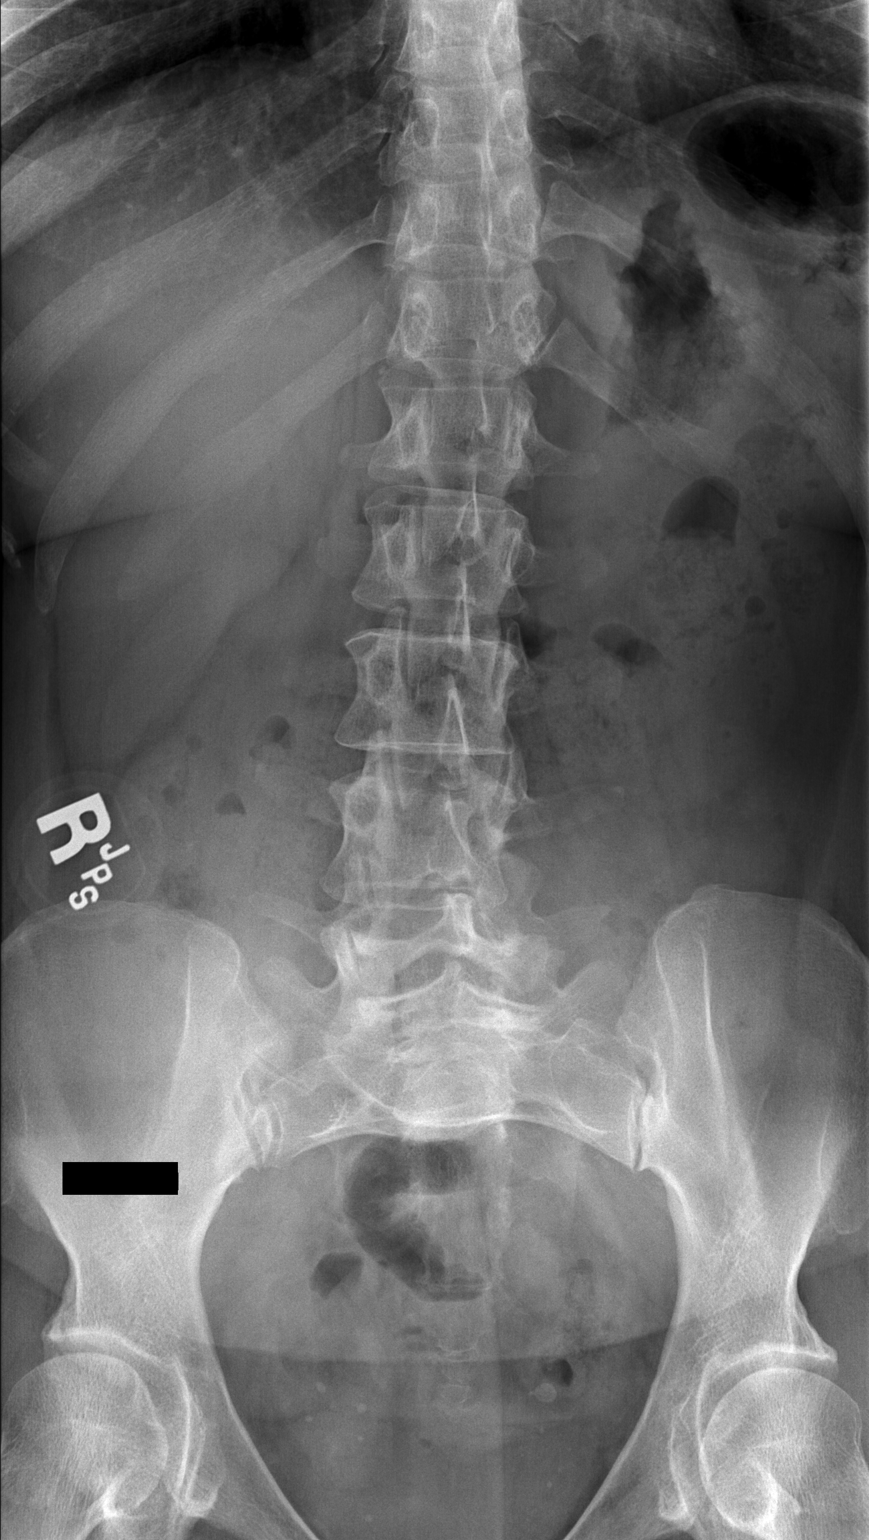

[w lumbar spine lat]
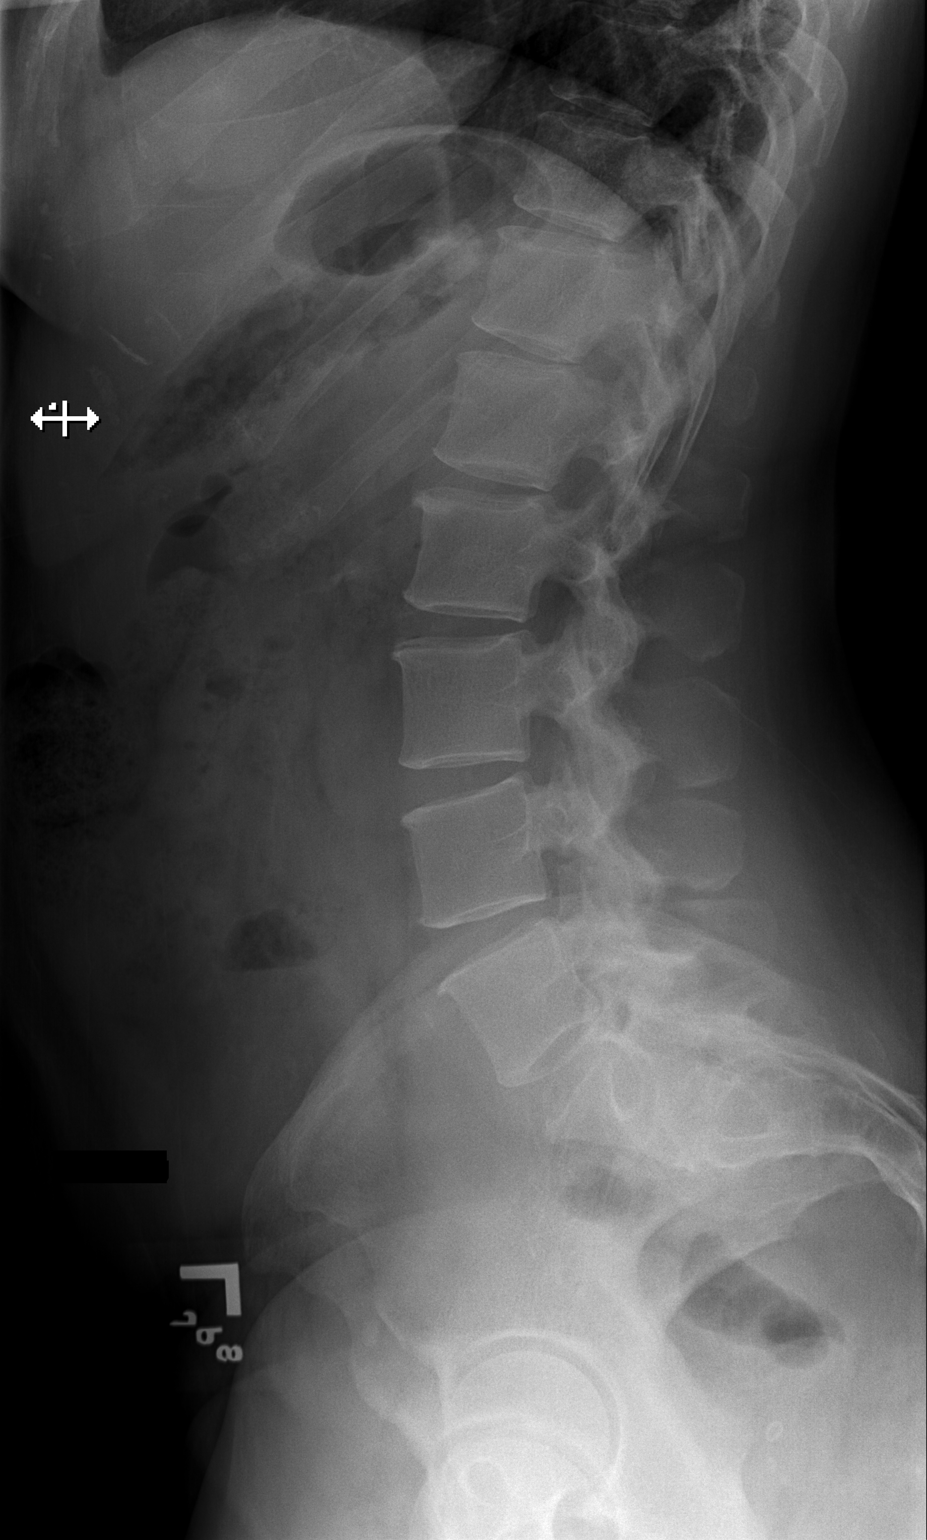

[2 of 2 positions shown; findings below may reference images not displayed]

FINDINGS: There is no evidence of lumbar spine fracture. Alignment is normal.
There is stable, mild multilevel endplate sclerosis which is
slightly more prominent at the level of L1-L2. Mild, stable
multilevel intervertebral disc space narrowing is also noted.
IMPRESSION: Mild, stable multilevel degenerative changes.

## 2022-07-20 DIAGNOSIS — J3489 Other specified disorders of nose and nasal sinuses: Secondary | ICD-10-CM | POA: Diagnosis not present

## 2022-07-20 DIAGNOSIS — R200 Anesthesia of skin: Secondary | ICD-10-CM | POA: Diagnosis not present

## 2022-07-20 DIAGNOSIS — R202 Paresthesia of skin: Secondary | ICD-10-CM | POA: Diagnosis not present

## 2022-07-22 IMAGING — CR DG LUMBAR SPINE 2-3V
4 series · 4 of 4 positions shown · non-contrast
Comparison: None Available.

CLINICAL DATA: Lumbar surgery

EXAM:
LUMBAR SPINE - 2-3 VIEW

[lateral (1 of 4)]
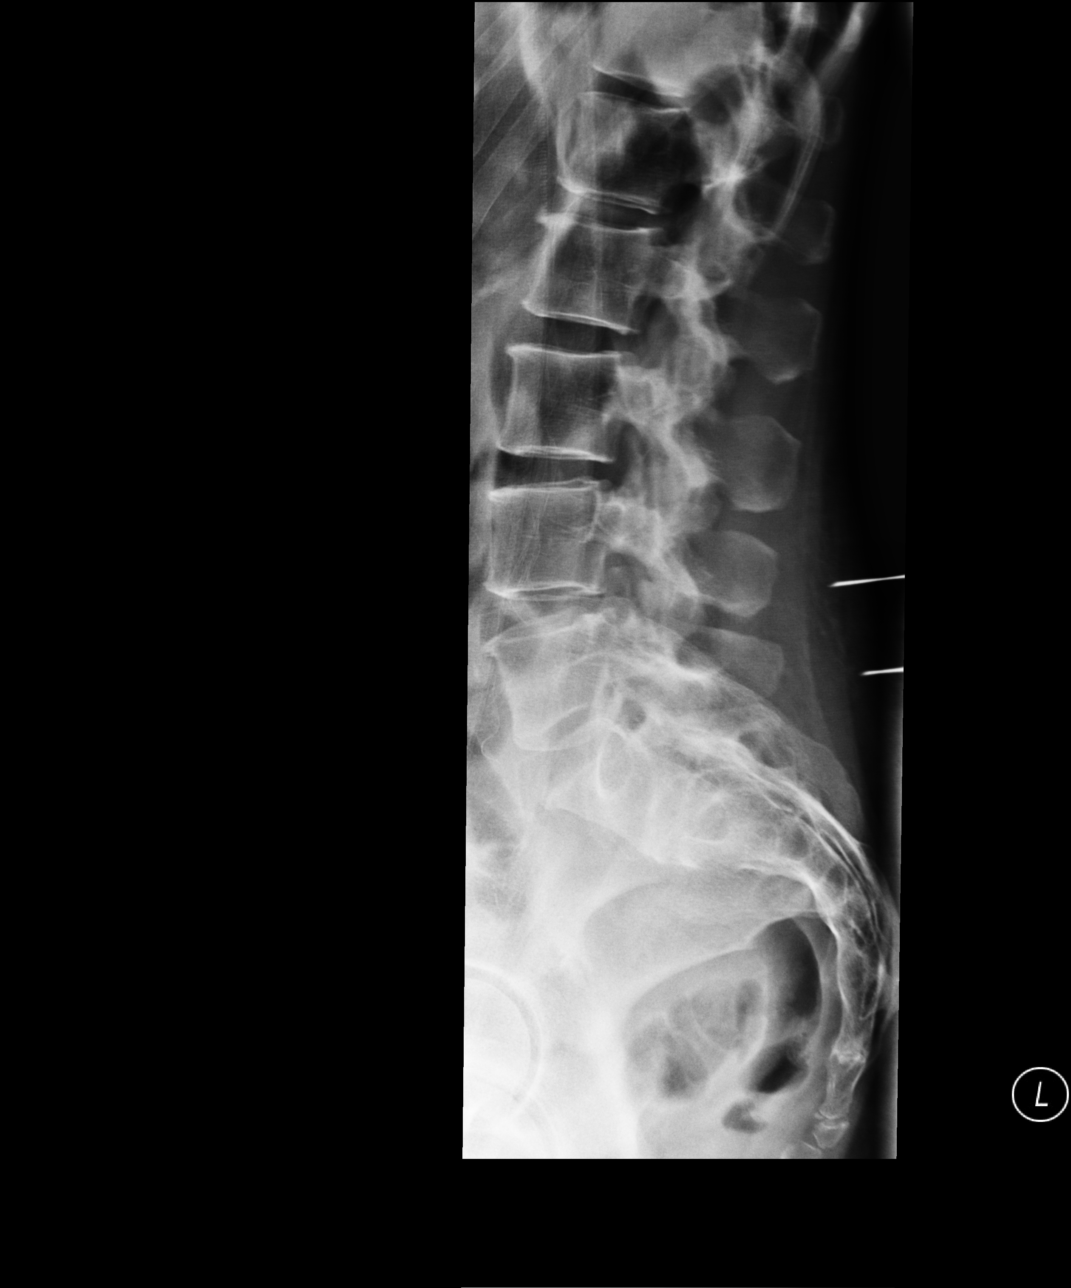

[lateral (2 of 4)]
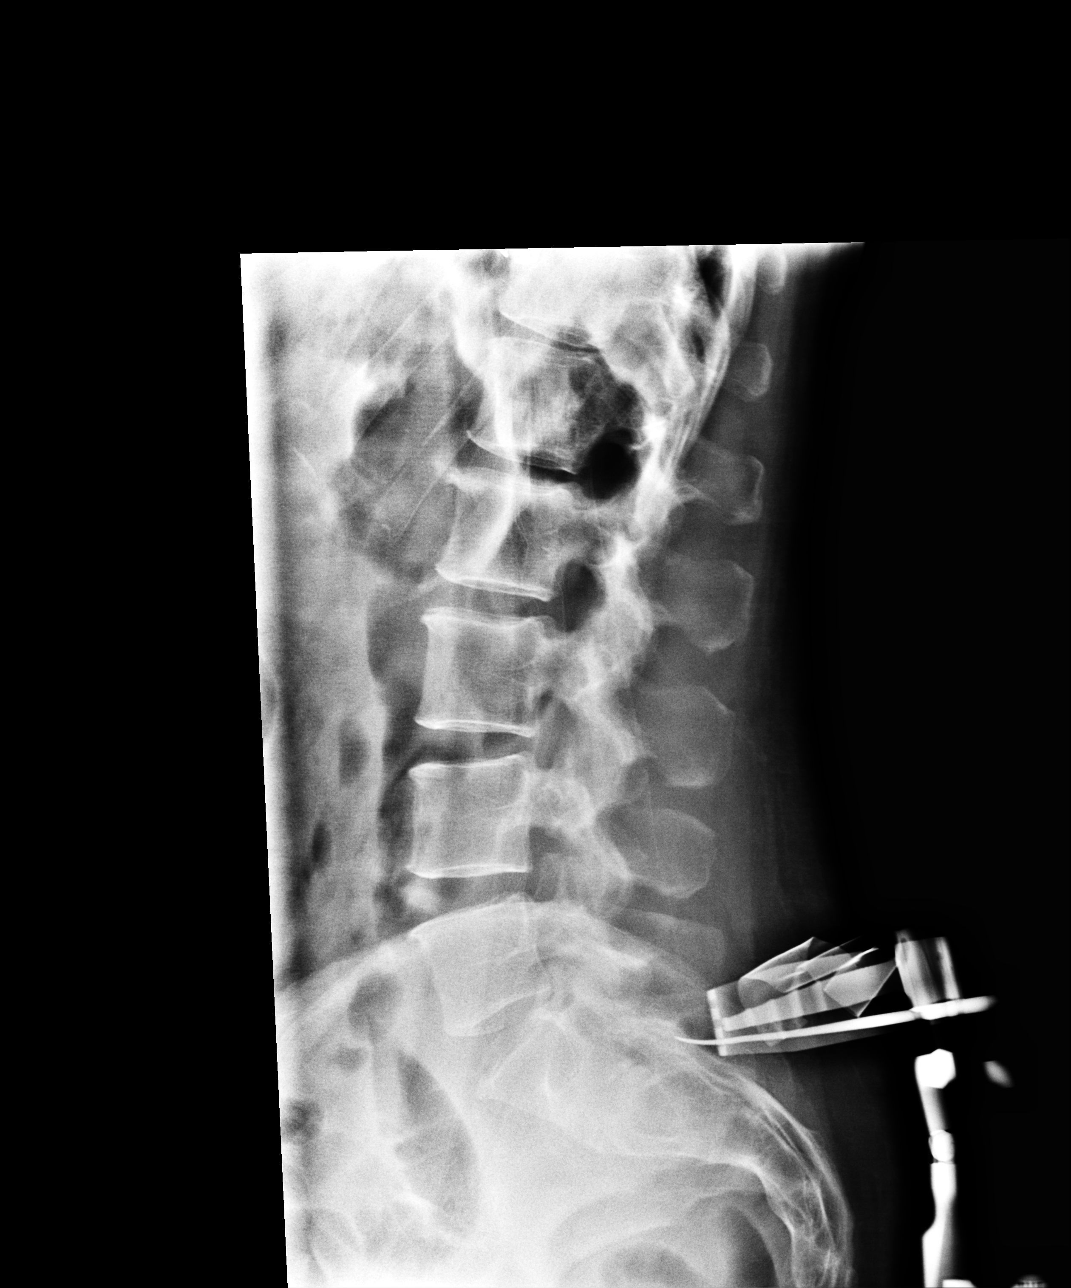

[lateral (3 of 4)]
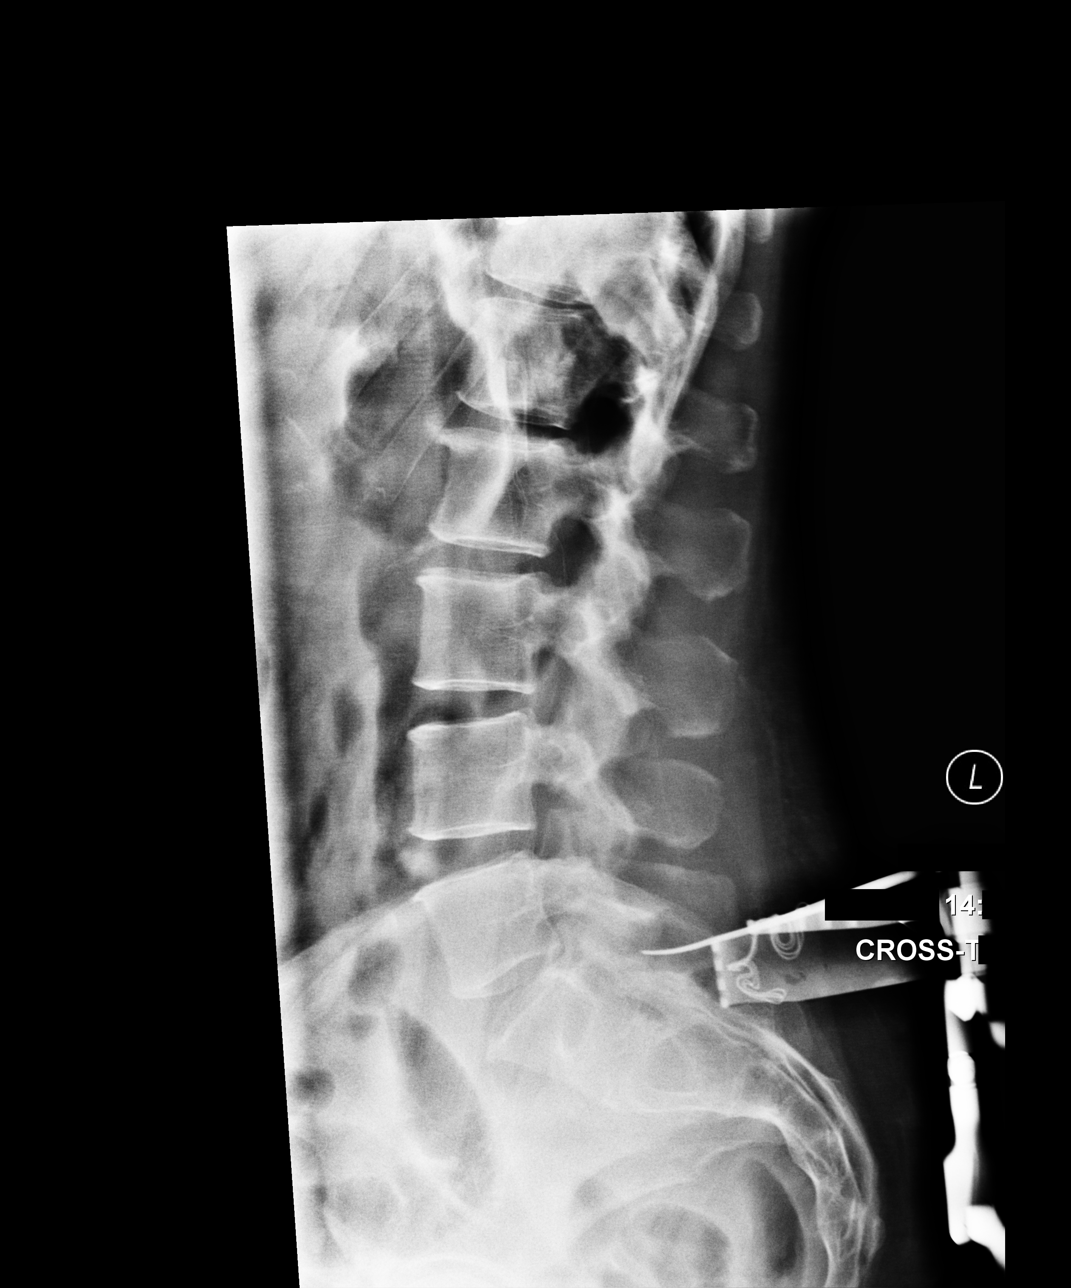

[lateral (4 of 4)]
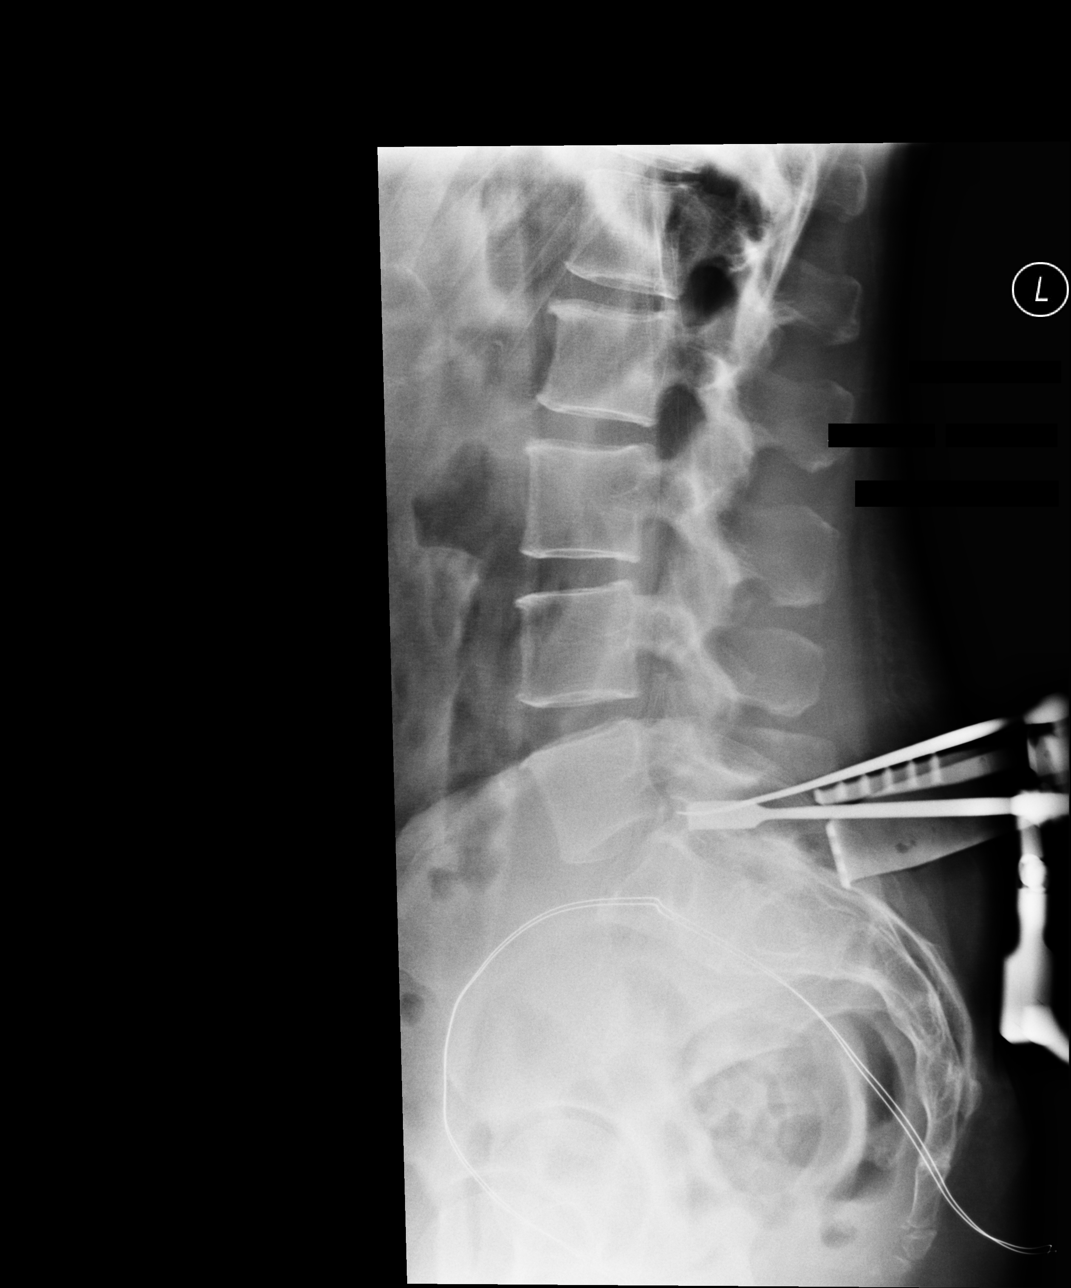

[4 of 4 positions shown; findings below may reference images not displayed]

FINDINGS: Several cross-table lateral intraoperative images are submitted. On
the final image, localizers are at the L5-S1 level.
IMPRESSION: Intraoperative localization.

## 2022-07-27 DIAGNOSIS — Z419 Encounter for procedure for purposes other than remedying health state, unspecified: Secondary | ICD-10-CM | POA: Diagnosis not present

## 2022-07-29 DIAGNOSIS — Z0001 Encounter for general adult medical examination with abnormal findings: Secondary | ICD-10-CM | POA: Diagnosis not present

## 2022-07-29 DIAGNOSIS — F419 Anxiety disorder, unspecified: Secondary | ICD-10-CM | POA: Diagnosis not present

## 2022-07-29 DIAGNOSIS — E559 Vitamin D deficiency, unspecified: Secondary | ICD-10-CM | POA: Diagnosis not present

## 2022-07-29 DIAGNOSIS — E0590 Thyrotoxicosis, unspecified without thyrotoxic crisis or storm: Secondary | ICD-10-CM | POA: Diagnosis not present

## 2022-07-29 DIAGNOSIS — E785 Hyperlipidemia, unspecified: Secondary | ICD-10-CM | POA: Diagnosis not present

## 2022-07-29 DIAGNOSIS — Z136 Encounter for screening for cardiovascular disorders: Secondary | ICD-10-CM | POA: Diagnosis not present

## 2022-07-29 DIAGNOSIS — D518 Other vitamin B12 deficiency anemias: Secondary | ICD-10-CM | POA: Diagnosis not present

## 2022-07-29 DIAGNOSIS — M542 Cervicalgia: Secondary | ICD-10-CM | POA: Diagnosis not present

## 2022-07-29 DIAGNOSIS — F319 Bipolar disorder, unspecified: Secondary | ICD-10-CM | POA: Diagnosis not present

## 2022-07-29 DIAGNOSIS — R7309 Other abnormal glucose: Secondary | ICD-10-CM | POA: Diagnosis not present

## 2022-08-11 DIAGNOSIS — G5601 Carpal tunnel syndrome, right upper limb: Secondary | ICD-10-CM | POA: Diagnosis not present

## 2022-08-19 DIAGNOSIS — R8290 Unspecified abnormal findings in urine: Secondary | ICD-10-CM | POA: Diagnosis not present

## 2022-08-19 DIAGNOSIS — R300 Dysuria: Secondary | ICD-10-CM | POA: Diagnosis not present

## 2022-08-27 DIAGNOSIS — Z419 Encounter for procedure for purposes other than remedying health state, unspecified: Secondary | ICD-10-CM | POA: Diagnosis not present

## 2022-09-11 DIAGNOSIS — Z1231 Encounter for screening mammogram for malignant neoplasm of breast: Secondary | ICD-10-CM | POA: Diagnosis not present

## 2022-09-14 DIAGNOSIS — F419 Anxiety disorder, unspecified: Secondary | ICD-10-CM | POA: Diagnosis not present

## 2022-09-14 DIAGNOSIS — N760 Acute vaginitis: Secondary | ICD-10-CM | POA: Diagnosis not present

## 2022-09-16 ENCOUNTER — Ambulatory Visit: Payer: Medicaid Other | Admitting: Cardiology

## 2022-09-18 DIAGNOSIS — R200 Anesthesia of skin: Secondary | ICD-10-CM | POA: Diagnosis not present

## 2022-09-18 DIAGNOSIS — R202 Paresthesia of skin: Secondary | ICD-10-CM | POA: Diagnosis not present

## 2022-09-27 DIAGNOSIS — Z419 Encounter for procedure for purposes other than remedying health state, unspecified: Secondary | ICD-10-CM | POA: Diagnosis not present

## 2022-10-06 DIAGNOSIS — L814 Other melanin hyperpigmentation: Secondary | ICD-10-CM | POA: Diagnosis not present

## 2022-10-06 DIAGNOSIS — L578 Other skin changes due to chronic exposure to nonionizing radiation: Secondary | ICD-10-CM | POA: Diagnosis not present

## 2022-10-06 DIAGNOSIS — D225 Melanocytic nevi of trunk: Secondary | ICD-10-CM | POA: Diagnosis not present

## 2022-10-06 DIAGNOSIS — L918 Other hypertrophic disorders of the skin: Secondary | ICD-10-CM | POA: Diagnosis not present

## 2022-10-15 DIAGNOSIS — G5601 Carpal tunnel syndrome, right upper limb: Secondary | ICD-10-CM | POA: Diagnosis not present

## 2022-10-15 DIAGNOSIS — M542 Cervicalgia: Secondary | ICD-10-CM | POA: Diagnosis not present

## 2022-10-15 DIAGNOSIS — E039 Hypothyroidism, unspecified: Secondary | ICD-10-CM | POA: Diagnosis not present

## 2022-10-15 DIAGNOSIS — R200 Anesthesia of skin: Secondary | ICD-10-CM | POA: Diagnosis not present

## 2022-10-15 HISTORY — DX: Carpal tunnel syndrome, right upper limb: G56.01

## 2022-10-15 HISTORY — DX: Hypothyroidism, unspecified: E03.9

## 2022-10-15 HISTORY — DX: Cervicalgia: M54.2

## 2022-10-15 HISTORY — DX: Anesthesia of skin: R20.0

## 2022-10-19 DIAGNOSIS — E0590 Thyrotoxicosis, unspecified without thyrotoxic crisis or storm: Secondary | ICD-10-CM | POA: Insufficient documentation

## 2022-10-19 NOTE — Progress Notes (Unsigned)
Cardiology Office Note:    Date:  10/20/2022   ID:  Andrea Singleton, DOB 1980-02-28, MRN [id number]  PCP:  Lars Mage, NP  Cardiologist:  Norman Herrlich, MD    Referring MD: [facility]*    ASSESSMENT:    1. Chest pain of uncertain etiology   2. Status post catheter ablation of slow pathway   3. Hyperlipidemia, unspecified hyperlipidemia type    PLAN:    In order of problems listed above:  Had no further chest discomfort she had a good evaluation in the ED no findings of acute coronary syndrome at this time unless she has ongoing chest pain I would not pursue a stress echocardiogram. Good long-term result no recurrent episodes of rapid heart rhythm She will continue her statin   Next appointment: I will see her back as needed   Medication Adjustments/Labs and Tests Ordered: Current medicines are reviewed at length with the patient today.  Concerns regarding medicines are outlined above.  No orders of the defined types were placed in this encounter.  No orders of the defined types were placed in this encounter.    History of Present Illness:    Andrea Singleton is a 42 y.o. female with a hx of chest pain hyperlipidemia SVT status post EP catheter ablation and palpitation last seen 06/16/2022.She was seen [facility] health emergency room 05/22/2022 for chest tightness there is no demonstrable arrhythmia EKG was described as normal she had a high-sensitivity troponin that was very low BMP with potassium 3.6 GFR greater than 60 cc CBC normal hemoglobin 13.6 and chest x-ray was normal. There is a notation she was advised to follow-up with cardiology afterwards. I independently reviewed the EKG which shows sinus rhythm poor R wave progression and minor ST depression. Is unchanged from an EKG April 2023. She had an EKG at [facility] August 2022 showing normal left ventricular size wall thickness systolic function physiologic tricuspid regurgitation otherwise normal  echocardiogram.  Compliance with diet, lifestyle and medications: Yes  She has had no further chest discomfort.  CTA was not performed because of insurance denial She is not having ongoing cardiac symptoms she had a pretty good extensive evaluation in the ED that was normal we decided to put off diagnostic cardiac imaging unless she has recurrent symptoms at that point we will plan on doing a stress echocardiogram in my office. No breakthrough episodes of tachycardia She tolerates her statin without muscle pain or weakness Past Medical History:  Diagnosis Date   Acquired hypothyroidism 10/15/2022   Anxiety    Bilateral hand numbness 10/15/2022   Bipolar disorder (HCC)    Carpal tunnel syndrome, right 10/15/2022   Depression    Dysrhythmia    SVT s/p ablation 2004   Elevated white blood cell count, unspecified    GERD (gastroesophageal reflux disease)    HNP (herniated nucleus pulposus), lumbar 05/30/2021   Hyperlipidemia    Hyperthyroidism    Neck pain 10/15/2022   Thyrotoxicosis    Vitamin D deficiency     Current Medications: Current Meds  Medication Sig   ARIPiprazole (ABILIFY) 30 MG tablet Take 1 tablet by mouth daily.   buPROPion (ZYBAN) 150 MG 12 hr tablet Take 150 mg by mouth daily.   diclofenac (VOLTAREN) 75 MG EC tablet Take 75 mg by mouth 2 (two) times daily.   EPINEPHrine 0.3 mg/0.3 mL IJ SOAJ injection Inject 0.3 mg into the muscle as needed for anaphylaxis.   methimazole (TAPAZOLE) 5 MG tablet Take 5  mg by mouth daily.   metroNIDAZOLE (FLAGYL) 500 MG tablet Take 500 mg by mouth 2 (two) times daily.   omeprazole (PRILOSEC) 20 MG capsule Take 20 mg by mouth at bedtime.   simvastatin (ZOCOR) 20 MG tablet Take 20 mg by mouth at bedtime.      EKGs/Labs/Other Studies Reviewed:    The following studies were reviewed today:  Cardiac Studies & Procedures       ECHOCARDIOGRAM  ECHOCARDIOGRAM COMPLETE 11/05/2020                 Recent Labs: 05/22/2022: BUN  9; Creatinine, Ser 0.97; Hemoglobin 13.6; Platelets 298; Potassium 3.6; Sodium 137; TSH 2.138  Recent Lipid Panel No results found for: "CHOL", "TRIG", "HDL", "CHOLHDL", "VLDL", "LDLCALC", "LDLDIRECT"  Physical Exam:    VS:  BP 106/60   Pulse 62   Ht 5\' 1"  (1.549 m)   Wt 146 lb 9.6 oz (66.5 kg)   SpO2 96%   BMI 27.70 kg/m     Wt Readings from Last 3 Encounters:  10/20/22 146 lb 9.6 oz (66.5 kg)  06/16/22 146 lb 6.4 oz (66.4 kg)  05/22/22 143 lb (64.9 kg)     GEN:  Well nourished, well developed in no acute distress HEENT: Normal NECK: No JVD; No carotid bruits LYMPHATICS: No lymphadenopathy CARDIAC: RRR, no murmurs, rubs, gallops RESPIRATORY:  Clear to auscultation without rales, wheezing or rhonchi  ABDOMEN: Soft, non-tender, non-distended MUSCULOSKELETAL:  No edema; No deformity  SKIN: Warm and dry NEUROLOGIC:  Alert and oriented x 3 PSYCHIATRIC:  Normal affect    Signed, Norman Herrlich, MD  10/20/2022 2:36 PM    [facility]

## 2022-10-20 ENCOUNTER — Ambulatory Visit: Payer: Medicaid Other | Attending: Cardiology | Admitting: Cardiology

## 2022-10-20 ENCOUNTER — Encounter: Payer: Self-pay | Admitting: Cardiology

## 2022-10-20 VITALS — BP 106/60 | HR 62 | Ht 61.0 in | Wt 146.6 lb

## 2022-10-20 DIAGNOSIS — Z8679 Personal history of other diseases of the circulatory system: Secondary | ICD-10-CM

## 2022-10-20 DIAGNOSIS — E785 Hyperlipidemia, unspecified: Secondary | ICD-10-CM

## 2022-10-20 DIAGNOSIS — R079 Chest pain, unspecified: Secondary | ICD-10-CM

## 2022-10-20 DIAGNOSIS — Z9889 Other specified postprocedural states: Secondary | ICD-10-CM | POA: Diagnosis not present

## 2022-10-20 NOTE — Patient Instructions (Signed)
Medication Instructions:  Your physician recommends that you continue on your current medications as directed. Please refer to the Current Medication list given to you today.  *If you need a refill on your cardiac medications before your next appointment, please call your pharmacy*   Lab Work: None If you have labs (blood work) drawn today and your tests are completely normal, you will receive your results only by: MyChart Message (if you have MyChart) OR A paper copy in the mail If you have any lab test that is abnormal or we need to change your treatment, we will call you to review the results.   Testing/Procedures: None   Follow-Up: At [facility], you and your health needs are our priority.  As part of our continuing mission to provide you with exceptional heart care, we have created designated Provider Care Teams.  These Care Teams include your primary Cardiologist (physician) and Advanced Practice Providers (APPs -  Physician Assistants and Nurse Practitioners) who all work together to provide you with the care you need, when you need it.  We recommend signing up for the patient portal called "MyChart".  Sign up information is provided on this After Visit Summary.  MyChart is used to connect with patients for Virtual Visits (Telemedicine).  Patients are able to view lab/test results, encounter notes, upcoming appointments, etc.  Non-urgent messages can be sent to your provider as well.   To learn more about what you can do with MyChart, go to [email or web address].    Your next appointment:   Follow up as needed  Provider:   Norman Herrlich, MD    Other Instructions None

## 2022-10-27 DIAGNOSIS — Z419 Encounter for procedure for purposes other than remedying health state, unspecified: Secondary | ICD-10-CM | POA: Diagnosis not present

## 2022-10-29 DIAGNOSIS — D518 Other vitamin B12 deficiency anemias: Secondary | ICD-10-CM | POA: Diagnosis not present

## 2022-10-29 DIAGNOSIS — K219 Gastro-esophageal reflux disease without esophagitis: Secondary | ICD-10-CM | POA: Diagnosis not present

## 2022-10-29 DIAGNOSIS — E785 Hyperlipidemia, unspecified: Secondary | ICD-10-CM | POA: Diagnosis not present

## 2022-10-29 DIAGNOSIS — Z13228 Encounter for screening for other metabolic disorders: Secondary | ICD-10-CM | POA: Diagnosis not present

## 2022-10-29 DIAGNOSIS — F319 Bipolar disorder, unspecified: Secondary | ICD-10-CM | POA: Diagnosis not present

## 2022-10-29 DIAGNOSIS — E0590 Thyrotoxicosis, unspecified without thyrotoxic crisis or storm: Secondary | ICD-10-CM | POA: Diagnosis not present

## 2022-11-27 DIAGNOSIS — Z419 Encounter for procedure for purposes other than remedying health state, unspecified: Secondary | ICD-10-CM | POA: Diagnosis not present

## 2022-12-11 DIAGNOSIS — F3132 Bipolar disorder, current episode depressed, moderate: Secondary | ICD-10-CM | POA: Diagnosis not present

## 2022-12-26 DIAGNOSIS — R070 Pain in throat: Secondary | ICD-10-CM | POA: Diagnosis not present

## 2022-12-26 DIAGNOSIS — Z20822 Contact with and (suspected) exposure to covid-19: Secondary | ICD-10-CM | POA: Diagnosis not present

## 2022-12-26 DIAGNOSIS — R058 Other specified cough: Secondary | ICD-10-CM | POA: Diagnosis not present

## 2022-12-27 DIAGNOSIS — Z419 Encounter for procedure for purposes other than remedying health state, unspecified: Secondary | ICD-10-CM | POA: Diagnosis not present

## 2023-01-08 DIAGNOSIS — F319 Bipolar disorder, unspecified: Secondary | ICD-10-CM | POA: Diagnosis not present

## 2023-01-08 DIAGNOSIS — Z1159 Encounter for screening for other viral diseases: Secondary | ICD-10-CM | POA: Diagnosis not present

## 2023-01-08 DIAGNOSIS — F419 Anxiety disorder, unspecified: Secondary | ICD-10-CM | POA: Diagnosis not present

## 2023-01-08 DIAGNOSIS — R059 Cough, unspecified: Secondary | ICD-10-CM | POA: Diagnosis not present

## 2023-01-08 DIAGNOSIS — F32A Depression, unspecified: Secondary | ICD-10-CM | POA: Diagnosis not present

## 2023-01-08 DIAGNOSIS — U071 COVID-19: Secondary | ICD-10-CM | POA: Diagnosis not present

## 2023-01-08 DIAGNOSIS — Z20828 Contact with and (suspected) exposure to other viral communicable diseases: Secondary | ICD-10-CM | POA: Diagnosis not present

## 2023-01-09 LAB — LAB REPORT - SCANNED
Calcium: 9.6
EGFR: 88.9

## 2023-01-15 DIAGNOSIS — M545 Low back pain, unspecified: Secondary | ICD-10-CM | POA: Diagnosis not present

## 2023-01-22 DIAGNOSIS — E785 Hyperlipidemia, unspecified: Secondary | ICD-10-CM | POA: Diagnosis not present

## 2023-01-22 DIAGNOSIS — F419 Anxiety disorder, unspecified: Secondary | ICD-10-CM | POA: Diagnosis not present

## 2023-01-22 DIAGNOSIS — F32A Depression, unspecified: Secondary | ICD-10-CM | POA: Diagnosis not present

## 2023-01-22 DIAGNOSIS — M5441 Lumbago with sciatica, right side: Secondary | ICD-10-CM | POA: Diagnosis not present

## 2023-01-22 DIAGNOSIS — K219 Gastro-esophageal reflux disease without esophagitis: Secondary | ICD-10-CM | POA: Diagnosis not present

## 2023-01-22 DIAGNOSIS — F319 Bipolar disorder, unspecified: Secondary | ICD-10-CM | POA: Diagnosis not present

## 2023-01-27 DIAGNOSIS — Z419 Encounter for procedure for purposes other than remedying health state, unspecified: Secondary | ICD-10-CM | POA: Diagnosis not present

## 2023-02-03 DIAGNOSIS — R202 Paresthesia of skin: Secondary | ICD-10-CM | POA: Diagnosis not present

## 2023-02-03 DIAGNOSIS — R200 Anesthesia of skin: Secondary | ICD-10-CM | POA: Diagnosis not present

## 2023-02-03 DIAGNOSIS — G5601 Carpal tunnel syndrome, right upper limb: Secondary | ICD-10-CM | POA: Diagnosis not present

## 2023-02-03 DIAGNOSIS — M542 Cervicalgia: Secondary | ICD-10-CM | POA: Diagnosis not present

## 2023-02-24 DIAGNOSIS — F319 Bipolar disorder, unspecified: Secondary | ICD-10-CM | POA: Diagnosis not present

## 2023-02-24 DIAGNOSIS — R945 Abnormal results of liver function studies: Secondary | ICD-10-CM | POA: Diagnosis not present

## 2023-02-24 DIAGNOSIS — R799 Abnormal finding of blood chemistry, unspecified: Secondary | ICD-10-CM | POA: Diagnosis not present

## 2023-02-24 DIAGNOSIS — F419 Anxiety disorder, unspecified: Secondary | ICD-10-CM | POA: Diagnosis not present

## 2023-02-24 DIAGNOSIS — Z13228 Encounter for screening for other metabolic disorders: Secondary | ICD-10-CM | POA: Diagnosis not present

## 2023-02-25 LAB — COMPREHENSIVE METABOLIC PANEL
Calcium: 8.9
EGFR: 95.7

## 2023-02-26 LAB — HEPATITIS PANEL, ACUTE: HM Hepatitis Screen: NEGATIVE

## 2023-02-27 DIAGNOSIS — Z419 Encounter for procedure for purposes other than remedying health state, unspecified: Secondary | ICD-10-CM | POA: Diagnosis not present

## 2023-03-17 DIAGNOSIS — M25561 Pain in right knee: Secondary | ICD-10-CM | POA: Diagnosis not present

## 2023-03-17 DIAGNOSIS — R7309 Other abnormal glucose: Secondary | ICD-10-CM | POA: Diagnosis not present

## 2023-03-17 DIAGNOSIS — E162 Hypoglycemia, unspecified: Secondary | ICD-10-CM | POA: Diagnosis not present

## 2023-03-17 DIAGNOSIS — R55 Syncope and collapse: Secondary | ICD-10-CM | POA: Diagnosis not present

## 2023-03-17 DIAGNOSIS — Z6829 Body mass index (BMI) 29.0-29.9, adult: Secondary | ICD-10-CM | POA: Diagnosis not present

## 2023-03-17 DIAGNOSIS — M25562 Pain in left knee: Secondary | ICD-10-CM | POA: Diagnosis not present

## 2023-03-18 LAB — LAB REPORT - SCANNED
A1c: 5.3
Calcium: 9.2
EGFR: 92.8

## 2023-03-22 ENCOUNTER — Other Ambulatory Visit: Payer: Self-pay | Admitting: *Deleted

## 2023-03-23 ENCOUNTER — Encounter: Payer: Self-pay | Admitting: Cardiology

## 2023-03-23 ENCOUNTER — Ambulatory Visit: Payer: Medicaid Other | Attending: Cardiology | Admitting: Cardiology

## 2023-03-23 VITALS — BP 80/60 | HR 72 | Ht 61.0 in | Wt 150.0 lb

## 2023-03-23 DIAGNOSIS — Z9889 Other specified postprocedural states: Secondary | ICD-10-CM | POA: Diagnosis not present

## 2023-03-23 DIAGNOSIS — R55 Syncope and collapse: Secondary | ICD-10-CM | POA: Diagnosis not present

## 2023-03-23 DIAGNOSIS — Z8679 Personal history of other diseases of the circulatory system: Secondary | ICD-10-CM

## 2023-03-23 DIAGNOSIS — I071 Rheumatic tricuspid insufficiency: Secondary | ICD-10-CM | POA: Diagnosis not present

## 2023-03-23 DIAGNOSIS — R011 Cardiac murmur, unspecified: Secondary | ICD-10-CM

## 2023-03-23 DIAGNOSIS — R002 Palpitations: Secondary | ICD-10-CM | POA: Diagnosis not present

## 2023-03-23 DIAGNOSIS — I471 Supraventricular tachycardia, unspecified: Secondary | ICD-10-CM

## 2023-03-23 NOTE — Progress Notes (Signed)
 Cardiology Office Note:  .   Date:  03/23/2023  ID:  Andrea Singleton, DOB Mar 22, 1980, MRN [id number] PCP: Lars Mage, NP  [facility] Providers Cardiologist:  Norman Herrlich, MD    History of Present Illness: .    Andrea Singleton is a 43 y.o. female with a past medical history of syncope and collapse, GERD, SVT s/p ablation 2004, hypothyroidism, carpal tunnel syndrome, dyslipidemia.   11/05/2020 echo EF 60 to 65%, normal diastolic filling pattern, mild to moderate tricuspid regurgitation 2004 catheter ablation x 2 for SVT  She established care with Dr. Dulce Sellar on 06/16/2022 the behest of her PCP for evaluation of chest pain and palpitations.  Coronary CTA was arranged however insurance ultimately denied testing.   She presents accompanied by her sister for follow-up of syncopal episodes.  She states these episodes have dated back over the last 20 years, around 2005. Her most recent event event was around two weeks ago, she was in the kitchen felt off, sat down and felt it was related to her blood sugar dropping >> queasy and diaphoretic then passed out for approximately 30 seconds. Her blood pressure is low today. 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  She has occasional palpitations however these are not bothersome for her, does not feel it is related to her syncopal events-she wore a monitor last year arranged by her PCP, I do not have access to these records however Dr. Dulce Sellar had reviewed them and advised her there was nothing worrisome. She denies chest pain, dyspnea, pnd, orthopnea, n, v, dizziness, syncope, edema, weight gain, or early satiety.   ROS: Review of Systems  Cardiovascular:  Positive for palpitations.  Neurological:  Positive for dizziness and loss of consciousness.  All other systems reviewed and are negative.    Studies Reviewed: Marland Kitchen   EKG  Interpretation Date/Time:  Tuesday March 23 2023 14:06:42 EST Ventricular Rate:  72 PR Interval:  136 QRS Duration:  104 QT Interval:  404 QTC Calculation: 442 R Axis:   72  Text Interpretation: Normal sinus rhythm Nonspecific ST abnormality When compared with ECG of 22-May-2022 17:07, PREVIOUS ECG IS PRESENT Confirmed by Wallis Bamberg [phone]) on 03/23/2023 3:53:46 PM     Risk Assessment/Calculations:             Physical Exam:   VS:  BP (!) 80/60 (BP Location: Left Arm, Patient Position: Sitting)   Pulse 72   Ht 5\' 1"  (1.549 m)   Wt 150 lb (68 kg)   SpO2 94%   BMI 28.34 kg/m    Wt Readings from Last 3 Encounters:  03/23/23 150 lb (68 kg)  10/20/22 146 lb 9.6 oz (66.5 kg)  06/16/22 146 lb 6.4 oz (66.4 kg)    GEN: Well nourished, well developed in no acute distress NECK: No JVD; No carotid bruits CARDIAC: RRR, 2/6 systolic murmur heard 4th ICS LSB, rubs, gallops RESPIRATORY:  Clear to auscultation without rales, wheezing or rhonchi  ABDOMEN: Soft, non-tender, non-distended EXTREMITIES:  No edema; No deformity   ASSESSMENT AND PLAN: .   Syncope and collapse-her episodes have been occurring on and off for 20 years, recently she has had about 2 in the last 6 months however there has not been a clear prodromal period to warn her as there previously was.  I think her symptoms are related to orthostasis and hypotension.  Her blood pressure is  low in the office today, and she is also on several medications that could contribute to her blood pressure being low.  Lab work was completed by her PCP, these records are not available for review but she advised there was nothing concerning.  Will request lab work from PCP office.  Hypertension-blood pressure is 80/60, she is on several medications that can contribute to this including Abilify, sertraline, meclizine, however she states she has always had a lower blood pressure reading.  She has previously been on salt tablets in the past  but is not interested in this.  Will have her increase her salt intake, have her increase her water intake to at least 64 ounces per day, also apply compression socks.  Palpitations-these are not bothersome for her, she does not feel it is associated with her episodes of syncope.  She wore a monitor last year arranged by her PCP, I do not have these records available for review by Dr. Dulce Sellar reviewed them and advised her there was nothing overly worrisome.  Murmur-will repeat echocardiogram, most recent in 2022 revealed mild to moderate tricuspid regurgitation to make sure this has not progressed.       Dispo: Echocardiogram, increase water intake, increase sodium intake, wear compression socks while out of bed.  Follow-up in 6 weeks.  Signed, Flossie Dibble, NP

## 2023-03-23 NOTE — Patient Instructions (Signed)
 Medication Instructions:  Your physician recommends that you continue on your current medications as directed. Please refer to the Current Medication list given to you today.  *If you need a refill on your cardiac medications before your next appointment, please call your pharmacy*   Lab Work: None If you have labs (blood work) drawn today and your tests are completely normal, you will receive your results only by: MyChart Message (if you have MyChart) OR A paper copy in the mail If you have any lab test that is abnormal or we need to change your treatment, we will call you to review the results.   Testing/Procedures: Your physician has requested that you have an echocardiogram. Echocardiography is a painless test that uses sound waves to create images of your heart. It provides your doctor with information about the size and shape of your heart and how well your heart's chambers and valves are working. This procedure takes approximately one hour. There are no restrictions for this procedure. Please do NOT wear cologne, perfume, aftershave, or lotions (deodorant is allowed). Please arrive 15 minutes prior to your appointment time.  Please note: We ask at that you not bring children with you during ultrasound (echo/ vascular) testing. Due to room size and safety concerns, children are not allowed in the ultrasound rooms during exams. Our front office staff cannot provide observation of children in our lobby area while testing is being conducted. An adult accompanying a patient to their appointment will only be allowed in the ultrasound room at the discretion of the ultrasound technician under special circumstances. We apologize for any inconvenience.    Follow-Up: At [facility], you and your health needs are our priority.  As part of our continuing mission to provide you with exceptional heart care, we have created designated Provider Care Teams.  These Care Teams include your  primary Cardiologist (physician) and Advanced Practice Providers (APPs -  Physician Assistants and Nurse Practitioners) who all work together to provide you with the care you need, when you need it.  We recommend signing up for the patient portal called "MyChart".  Sign up information is provided on this After Visit Summary.  MyChart is used to connect with patients for Virtual Visits (Telemedicine).  Patients are able to view lab/test results, encounter notes, upcoming appointments, etc.  Non-urgent messages can be sent to your provider as well.   To learn more about what you can do with MyChart, go to [email or web address].    Your next appointment:   6 week(s)  Provider:   Wallis Bamberg, NP [facility])    Other Instructions Drink at least 64 ounces of water per day  Wear compression socks   Increase salt in your diet

## 2023-03-24 ENCOUNTER — Ambulatory Visit: Payer: Medicaid Other | Attending: Cardiology

## 2023-03-24 DIAGNOSIS — Z8679 Personal history of other diseases of the circulatory system: Secondary | ICD-10-CM

## 2023-03-24 DIAGNOSIS — R011 Cardiac murmur, unspecified: Secondary | ICD-10-CM

## 2023-03-24 DIAGNOSIS — R002 Palpitations: Secondary | ICD-10-CM | POA: Diagnosis not present

## 2023-03-24 DIAGNOSIS — Z9889 Other specified postprocedural states: Secondary | ICD-10-CM | POA: Diagnosis not present

## 2023-03-24 LAB — ECHOCARDIOGRAM COMPLETE
AR max vel: 1.57 cm2
AV Area VTI: 1.57 cm2
AV Area mean vel: 1.6 cm2
AV Mean grad: 6.7 mmHg
AV Peak grad: 12 mmHg
Ao pk vel: 1.73 m/s
Area-P 1/2: 4.18 cm2
S' Lateral: 3.2 cm

## 2023-03-27 DIAGNOSIS — Z419 Encounter for procedure for purposes other than remedying health state, unspecified: Secondary | ICD-10-CM | POA: Diagnosis not present

## 2023-03-30 DIAGNOSIS — E162 Hypoglycemia, unspecified: Secondary | ICD-10-CM | POA: Diagnosis not present

## 2023-03-30 DIAGNOSIS — R252 Cramp and spasm: Secondary | ICD-10-CM | POA: Diagnosis not present

## 2023-03-30 DIAGNOSIS — R197 Diarrhea, unspecified: Secondary | ICD-10-CM | POA: Diagnosis not present

## 2023-03-30 DIAGNOSIS — R1084 Generalized abdominal pain: Secondary | ICD-10-CM | POA: Diagnosis not present

## 2023-04-02 DIAGNOSIS — R1084 Generalized abdominal pain: Secondary | ICD-10-CM | POA: Diagnosis not present

## 2023-04-02 DIAGNOSIS — R197 Diarrhea, unspecified: Secondary | ICD-10-CM | POA: Diagnosis not present

## 2023-04-05 DIAGNOSIS — M25561 Pain in right knee: Secondary | ICD-10-CM | POA: Diagnosis not present

## 2023-04-05 DIAGNOSIS — M25562 Pain in left knee: Secondary | ICD-10-CM | POA: Diagnosis not present

## 2023-04-22 DIAGNOSIS — E0590 Thyrotoxicosis, unspecified without thyrotoxic crisis or storm: Secondary | ICD-10-CM | POA: Diagnosis not present

## 2023-04-22 DIAGNOSIS — E049 Nontoxic goiter, unspecified: Secondary | ICD-10-CM | POA: Diagnosis not present

## 2023-04-22 DIAGNOSIS — R7303 Prediabetes: Secondary | ICD-10-CM | POA: Diagnosis not present

## 2023-04-27 DIAGNOSIS — E0590 Thyrotoxicosis, unspecified without thyrotoxic crisis or storm: Secondary | ICD-10-CM | POA: Diagnosis not present

## 2023-04-27 DIAGNOSIS — E0500 Thyrotoxicosis with diffuse goiter without thyrotoxic crisis or storm: Secondary | ICD-10-CM | POA: Diagnosis not present

## 2023-04-27 DIAGNOSIS — E0520 Thyrotoxicosis with toxic multinodular goiter without thyrotoxic crisis or storm: Secondary | ICD-10-CM | POA: Diagnosis not present

## 2023-04-27 DIAGNOSIS — E049 Nontoxic goiter, unspecified: Secondary | ICD-10-CM | POA: Diagnosis not present

## 2023-04-30 ENCOUNTER — Other Ambulatory Visit: Payer: Self-pay

## 2023-04-30 DIAGNOSIS — F419 Anxiety disorder, unspecified: Secondary | ICD-10-CM | POA: Diagnosis not present

## 2023-04-30 DIAGNOSIS — I471 Supraventricular tachycardia, unspecified: Secondary | ICD-10-CM | POA: Insufficient documentation

## 2023-04-30 DIAGNOSIS — Z1321 Encounter for screening for nutritional disorder: Secondary | ICD-10-CM | POA: Diagnosis not present

## 2023-04-30 DIAGNOSIS — M25561 Pain in right knee: Secondary | ICD-10-CM | POA: Diagnosis not present

## 2023-04-30 DIAGNOSIS — Z6829 Body mass index (BMI) 29.0-29.9, adult: Secondary | ICD-10-CM | POA: Diagnosis not present

## 2023-04-30 DIAGNOSIS — F319 Bipolar disorder, unspecified: Secondary | ICD-10-CM | POA: Diagnosis not present

## 2023-04-30 DIAGNOSIS — M25562 Pain in left knee: Secondary | ICD-10-CM | POA: Diagnosis not present

## 2023-04-30 DIAGNOSIS — E0590 Thyrotoxicosis, unspecified without thyrotoxic crisis or storm: Secondary | ICD-10-CM | POA: Diagnosis not present

## 2023-04-30 DIAGNOSIS — E559 Vitamin D deficiency, unspecified: Secondary | ICD-10-CM | POA: Diagnosis not present

## 2023-04-30 DIAGNOSIS — Z13228 Encounter for screening for other metabolic disorders: Secondary | ICD-10-CM | POA: Diagnosis not present

## 2023-04-30 DIAGNOSIS — F32A Depression, unspecified: Secondary | ICD-10-CM | POA: Diagnosis not present

## 2023-05-03 ENCOUNTER — Encounter: Payer: Self-pay | Admitting: Cardiology

## 2023-05-03 NOTE — Progress Notes (Signed)
 " Cardiology Office Note:  .   Date:  05/04/2023  ID:  Andrea Singleton, DOB November 17, 1980, MRN [id number] PCP: Nestor Elston NOVAK, NP  [facility] Providers Cardiologist:  Redell Leiter, MD    History of Present Illness: .    Andrea Singleton is a 43 y.o. female with a past medical history of syncope and collapse, GERD, SVT s/p ablation 2004, hypothyroidism, carpal tunnel syndrome, dyslipidemia, tricuspid regurgitation.   03/24/2023 echo EF 60 to 65%, mild TR 11/05/2020 echo EF 60 to 65%, normal diastolic filling pattern, mild to moderate tricuspid regurgitation 2004 catheter ablation x 2 for SVT  She established care with Dr. Leiter on 06/16/2022 the behest of her PCP for evaluation of chest pain and palpitations.  Coronary CTA was arranged however insurance ultimately denied testing.   Evaluated by myself on 03/23/2023 for follow-up of syncopal events.  We reviewed her history, this has been occurring on and off for the previous 20 years, her most recent event had occurred when she was in the kitchen, felt queasy and diaphoretic and then passed out for about 30 seconds.  She works as a child psychotherapist, drinks a high amount of caffeine, not drinking as much water as she stood.  We arranged for repeat echocardiogram which was noncontributory, recommended supportive measures.  She presents today for follow up of her orthostatic hypotension. She is doing well with supportive measure, has not had further syncopal events. She has increased her water intake considerably, and decreased her caffeine intake. She has been diagnosed with prediabetes and will be meeting with someone to help augment her dietary changes. She denies chest pain, palpitations, dyspnea, pnd, orthopnea, n, v, dizziness, syncope, edema, weight gain, or early satiety.   ROS: Review of Systems  Neurological:  Positive for dizziness.  All other systems reviewed and are negative.    Studies Reviewed: .         Risk  Assessment/Calculations:             Physical Exam:   VS:  BP (!) 100/54   Pulse 66   Ht 5' 1 (1.549 m)   Wt 151 lb (68.5 kg)   SpO2 92%   BMI 28.53 kg/m    Wt Readings from Last 3 Encounters:  05/04/23 151 lb (68.5 kg)  03/23/23 150 lb (68 kg)  10/20/22 146 lb 9.6 oz (66.5 kg)    GEN: Well nourished, well developed in no acute distress NECK: No JVD; No carotid bruits CARDIAC: RRR, 2/6 systolic murmur heard 4th ICS LSB, rubs, gallops RESPIRATORY:  Clear to auscultation without rales, wheezing or rhonchi  ABDOMEN: Soft, non-tender, non-distended EXTREMITIES:  No edema; No deformity   ASSESSMENT AND PLAN: .   Syncope and collapse-no further episodes of syncope. 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 She has liberalized her salt intake, increased her water intake, and is overall relatively asymptomatic currently.  She does have plans to lose weight as she was recently diagnosed with prediabetes, did discuss however that her symptoms could be exacerbated with weight loss.  Hypotension-blood pressure was 80/60 at her last visit, today is 100/54, she is on several medications that can contribute to this including Abilify , sertraline, meclizine, however she states she has always had a lower blood pressure reading.  She has previously been on salt tablets in the past but is not interested in this.  Previously recommended supportive measures which she has implemented and is feeling better.  Palpitations-these are not  bothersome for her, she does not feel it is associated with her episodes of syncope.  She wore a monitor last year arranged by her PCP, I do not have these records available for review by Dr. Monetta reviewed them and advised her there was nothing overly worrisome.       Dispo: Continue with supportive measures, water, salt, compression socks.  Notify via MyChart if she has any further syncopal events otherwise follow-up  in 1 year.  Signed, Delon JAYSON Hoover, NP  "

## 2023-05-04 ENCOUNTER — Ambulatory Visit: Payer: Medicaid Other | Attending: Cardiology | Admitting: Cardiology

## 2023-05-04 ENCOUNTER — Encounter: Payer: Self-pay | Admitting: Cardiology

## 2023-05-04 VITALS — BP 100/54 | HR 66 | Ht 61.0 in | Wt 151.0 lb

## 2023-05-04 DIAGNOSIS — R55 Syncope and collapse: Secondary | ICD-10-CM | POA: Diagnosis not present

## 2023-05-04 DIAGNOSIS — E162 Hypoglycemia, unspecified: Secondary | ICD-10-CM | POA: Diagnosis not present

## 2023-05-04 DIAGNOSIS — E782 Mixed hyperlipidemia: Secondary | ICD-10-CM | POA: Diagnosis not present

## 2023-05-04 DIAGNOSIS — I10 Essential (primary) hypertension: Secondary | ICD-10-CM

## 2023-05-04 DIAGNOSIS — R002 Palpitations: Secondary | ICD-10-CM | POA: Diagnosis not present

## 2023-05-04 NOTE — Patient Instructions (Addendum)
 Medication Instructions:  Your physician recommends that you continue on your current medications as directed. Please refer to the Current Medication list given to you today.  *If you need a refill on your cardiac medications before your next appointment, please call your pharmacy*  Lab Work: None If you have labs (blood work) drawn today and your tests are completely normal, you will receive your results only by: MyChart Message (if you have MyChart) OR A paper copy in the mail If you have any lab test that is abnormal or we need to change your treatment, we will call you to review the results.  Testing/Procedures: None  Follow-Up: At [facility], you and your health needs are our priority.  As part of our continuing mission to provide you with exceptional heart care, our providers are all part of one team.  This team includes your primary Cardiologist (physician) and Advanced Practice Providers or APPs (Physician Assistants and Nurse Practitioners) who all work together to provide you with the care you need, when you need it.  Your next appointment:   1 year(s)  Provider:   Wallis Bamberg, NP [facility])    We recommend signing up for the patient portal called "MyChart".  Sign up information is provided on this After Visit Summary.  MyChart is used to connect with patients for Virtual Visits (Telemedicine).  Patients are able to view lab/test results, encounter notes, upcoming appointments, etc.  Non-urgent messages can be sent to your provider as well.   To learn more about what you can do with MyChart, go to [email or web address].   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

## 2023-05-08 DIAGNOSIS — Z419 Encounter for procedure for purposes other than remedying health state, unspecified: Secondary | ICD-10-CM | POA: Diagnosis not present

## 2023-05-31 DIAGNOSIS — Z1159 Encounter for screening for other viral diseases: Secondary | ICD-10-CM | POA: Diagnosis not present

## 2023-05-31 DIAGNOSIS — R0981 Nasal congestion: Secondary | ICD-10-CM | POA: Diagnosis not present

## 2023-05-31 DIAGNOSIS — J0110 Acute frontal sinusitis, unspecified: Secondary | ICD-10-CM | POA: Diagnosis not present

## 2023-05-31 DIAGNOSIS — Z20828 Contact with and (suspected) exposure to other viral communicable diseases: Secondary | ICD-10-CM | POA: Diagnosis not present

## 2023-05-31 DIAGNOSIS — R0602 Shortness of breath: Secondary | ICD-10-CM | POA: Diagnosis not present

## 2023-05-31 DIAGNOSIS — R051 Acute cough: Secondary | ICD-10-CM | POA: Diagnosis not present

## 2023-05-31 DIAGNOSIS — R067 Sneezing: Secondary | ICD-10-CM | POA: Diagnosis not present

## 2023-05-31 DIAGNOSIS — Z6828 Body mass index (BMI) 28.0-28.9, adult: Secondary | ICD-10-CM | POA: Diagnosis not present

## 2023-06-07 DIAGNOSIS — Z419 Encounter for procedure for purposes other than remedying health state, unspecified: Secondary | ICD-10-CM | POA: Diagnosis not present

## 2023-06-08 DIAGNOSIS — R194 Change in bowel habit: Secondary | ICD-10-CM | POA: Diagnosis not present

## 2023-06-08 DIAGNOSIS — K219 Gastro-esophageal reflux disease without esophagitis: Secondary | ICD-10-CM | POA: Diagnosis not present

## 2023-07-08 DIAGNOSIS — Z419 Encounter for procedure for purposes other than remedying health state, unspecified: Secondary | ICD-10-CM | POA: Diagnosis not present

## 2023-07-15 DIAGNOSIS — A630 Anogenital (venereal) warts: Secondary | ICD-10-CM | POA: Diagnosis not present

## 2023-08-07 DIAGNOSIS — Z419 Encounter for procedure for purposes other than remedying health state, unspecified: Secondary | ICD-10-CM | POA: Diagnosis not present

## 2023-08-16 DIAGNOSIS — I959 Hypotension, unspecified: Secondary | ICD-10-CM | POA: Diagnosis not present

## 2023-08-16 DIAGNOSIS — R531 Weakness: Secondary | ICD-10-CM | POA: Diagnosis not present

## 2023-08-16 DIAGNOSIS — R110 Nausea: Secondary | ICD-10-CM | POA: Diagnosis not present

## 2023-08-16 DIAGNOSIS — I9589 Other hypotension: Secondary | ICD-10-CM | POA: Diagnosis not present

## 2023-08-17 DIAGNOSIS — Z79899 Other long term (current) drug therapy: Secondary | ICD-10-CM | POA: Diagnosis not present

## 2023-08-17 DIAGNOSIS — F1290 Cannabis use, unspecified, uncomplicated: Secondary | ICD-10-CM | POA: Diagnosis not present

## 2023-08-17 DIAGNOSIS — R5383 Other fatigue: Secondary | ICD-10-CM | POA: Diagnosis not present

## 2023-08-17 DIAGNOSIS — R001 Bradycardia, unspecified: Secondary | ICD-10-CM | POA: Diagnosis not present

## 2023-08-17 DIAGNOSIS — R031 Nonspecific low blood-pressure reading: Secondary | ICD-10-CM | POA: Diagnosis not present

## 2023-08-17 DIAGNOSIS — F17210 Nicotine dependence, cigarettes, uncomplicated: Secondary | ICD-10-CM | POA: Diagnosis not present

## 2023-08-17 DIAGNOSIS — R42 Dizziness and giddiness: Secondary | ICD-10-CM | POA: Diagnosis not present

## 2023-08-18 ENCOUNTER — Telehealth: Payer: Self-pay | Admitting: Cardiology

## 2023-08-18 NOTE — Telephone Encounter (Signed)
 Called the patient and she stated that yesterday she went to the ER for low blood pressure. When she was discharged she was advised to follow up with her cardiologist. An appointment was scheduled for her with Delon Hoover, NP on Friday August 8th. Patient verbalized understanding and had no further questions at this time.

## 2023-08-18 NOTE — Telephone Encounter (Signed)
 New Message:     Patient says she was seen in [place] ER on yesterday(08-17-23) for low blood pressure. She said she was told to follow up with her Cardiologist. No availability until  the end of August.

## 2023-08-20 DIAGNOSIS — R112 Nausea with vomiting, unspecified: Secondary | ICD-10-CM | POA: Diagnosis not present

## 2023-08-20 DIAGNOSIS — R7303 Prediabetes: Secondary | ICD-10-CM | POA: Diagnosis not present

## 2023-08-20 DIAGNOSIS — Z7689 Persons encountering health services in other specified circumstances: Secondary | ICD-10-CM | POA: Diagnosis not present

## 2023-08-20 DIAGNOSIS — R42 Dizziness and giddiness: Secondary | ICD-10-CM | POA: Diagnosis not present

## 2023-08-20 DIAGNOSIS — H6593 Unspecified nonsuppurative otitis media, bilateral: Secondary | ICD-10-CM | POA: Diagnosis not present

## 2023-08-20 DIAGNOSIS — R519 Headache, unspecified: Secondary | ICD-10-CM | POA: Diagnosis not present

## 2023-08-24 DIAGNOSIS — I071 Rheumatic tricuspid insufficiency: Secondary | ICD-10-CM | POA: Insufficient documentation

## 2023-08-25 ENCOUNTER — Ambulatory Visit: Attending: Cardiology | Admitting: Cardiology

## 2023-08-25 ENCOUNTER — Encounter: Payer: Self-pay | Admitting: Cardiology

## 2023-08-25 VITALS — BP 104/66 | HR 74 | Ht 61.0 in | Wt 140.6 lb

## 2023-08-25 DIAGNOSIS — R0609 Other forms of dyspnea: Secondary | ICD-10-CM

## 2023-08-25 DIAGNOSIS — F17210 Nicotine dependence, cigarettes, uncomplicated: Secondary | ICD-10-CM

## 2023-08-25 DIAGNOSIS — I950 Idiopathic hypotension: Secondary | ICD-10-CM | POA: Diagnosis not present

## 2023-08-25 DIAGNOSIS — E782 Mixed hyperlipidemia: Secondary | ICD-10-CM | POA: Diagnosis not present

## 2023-08-25 DIAGNOSIS — I959 Hypotension, unspecified: Secondary | ICD-10-CM | POA: Insufficient documentation

## 2023-08-25 MED ORDER — FLUDROCORTISONE ACETATE 0.1 MG PO TABS: 0.0500 mg | ORAL_TABLET | Freq: Every day | ORAL | 3 refills | Status: AC

## 2023-08-25 NOTE — Progress Notes (Signed)
 Cardiology Office Note:    Date:  08/25/2023   ID:  Andrea Singleton, DOB February 04, 1980, MRN [id number]  PCP:  Andrea Elston NOVAK, NP  Cardiologist:  Andrea JONELLE Crape, MD   Referring MD: Andrea Elston NOVAK, NP    ASSESSMENT:    1. Mixed hyperlipidemia   2. DOE (dyspnea on exertion)   3. Cigarette smoker   4. Idiopathic hypotension    PLAN:    In order of problems listed above:  Primary prevention stressed with the patient.  Importance of compliance with diet medication stressed and patient verbalized standing. I reviewed records from emergency room and primary care for EKG.  I discussed this with her at extensive length. Dyspnea on exertion: Will do a stress echo to evaluate this.  She is agreeable. Hypertension: Her blood pressure remains low.  Since her last evaluated at our office she has increased salt and water intake but this has not helped her.  Will add fludrocortisone  low-dose.  She will keep a track of her pulse blood pressures at home every day and will come back for nurse visit in 1 week for pulse blood pressure and Chem-7. Dyslipidemia: On lipid-lowering medications followed by primary care. In view of risk factors calcium scoring CT was recommended and she is agreeable. Patient will be seen in follow-up appointment in 6 months or earlier if the patient has any concerns.  Addendum:  Cigarette smoker: I spent 5 minutes with the patient discussing solely about smoking. Smoking cessation was counseled. I suggested to the patient also different medications and pharmacological interventions. Patient is keen to try stopping on its own at this time. He will get back to me if he needs any further assistance in this matter.  I added this note today because I forgot mentioning cigarette smoking counseling in my note from yesterday.    Medication Adjustments/Labs and Tests Ordered: Current medicines are reviewed at length with the patient today.  Concerns regarding medicines are  outlined above.  No orders of the defined types were placed in this encounter.  No orders of the defined types were placed in this encounter.    No chief complaint on file.    History of Present Illness:    Andrea Singleton is a 43 y.o. female.  Patient is previously unknown to me.  She is seen with daughter.  She went to the hospital because of generalized fatigue and was treated and released.  Subsequently she is seen her primary care provider.  She was told to have some irregularity of the EKG.  She denies any chest pain orthopnea PND or syncope.  At the time of my evaluation, the patient is alert awake oriented and in no distress.  She has history of mixed dyslipidemia and unfortunately continues to smoke.  She has some dyspnea on exertion.  Past Medical History:  Diagnosis Date   Acquired hypothyroidism 10/15/2022   Anxiety    Bilateral hand numbness 10/15/2022   Bipolar disorder (HCC)    Carpal tunnel syndrome, right 10/15/2022   Depression    Dysrhythmia    SVT s/p ablation 2004   Elevated white blood cell count, unspecified    GERD (gastroesophageal reflux disease)    HNP (herniated nucleus pulposus), lumbar 05/30/2021   Hyperlipidemia    Hyperthyroidism    Neck pain 10/15/2022   SVT (supraventricular tachycardia) (HCC)    Thyrotoxicosis    Tricuspid regurgitation    Vitamin D deficiency     Past Surgical History:  Procedure Laterality  Date   ABDOMINAL HYSTERECTOMY  07/2006   ABLATION  2004   2 Catheter Ablations for SVT's, [facility]   KNEE ARTHROSCOPY Left    LUMBAR LAMINECTOMY/DECOMPRESSION MICRODISCECTOMY Right 05/30/2021   Procedure: Microdiscectomy L5-S1 Right;  Surgeon: Andrea Purchase, MD;  Location: [facility] OR;  Service: Orthopedics;  Laterality: Right;  2 hrs 3 C-Bed   WRIST SURGERY Right 10/15/2020   1st Extensor Compartment Release    Current Medications: Current Meds  Medication Sig   APPLE CIDER VINEGAR PO Take 450 mg by mouth daily.    ARIPiprazole  (ABILIFY ) 30 MG tablet Take 1 tablet by mouth daily.   Calcium Polycarbophil (FIBER) 625 MG TABS Take 2 tablets by mouth 2 (two) times daily.   EPINEPHrine  0.3 mg/0.3 mL IJ SOAJ injection Inject 0.3 mg into the muscle as needed for anaphylaxis.   meclizine (ANTIVERT) 12.5 MG tablet Take 12.5 mg by mouth 3 (three) times daily as needed for dizziness.   methimazole  (TAPAZOLE ) 5 MG tablet Take 5 mg by mouth daily.   Multiple Vitamins-Minerals (MULTIVITAMIN WITH MINERALS) tablet Take 1 tablet by mouth daily.   pantoprazole (PROTONIX) 40 MG tablet Take 40 mg by mouth daily.   sertraline (ZOLOFT) 25 MG tablet Take 25 mg by mouth daily.   simvastatin (ZOCOR) 20 MG tablet Take 20 mg by mouth at bedtime.   [DISCONTINUED] diclofenac (VOLTAREN) 75 MG EC tablet Take 75 mg by mouth 2 (two) times daily.   [DISCONTINUED] Multiple Vitamins-Minerals (HAIR SKIN AND NAILS FORMULA PO) Take 1 tablet by mouth daily.     Allergies:   Daucus carota and Penicillins   Social History   Socioeconomic History   Marital status: Single    Spouse name: Not on file   Number of children: Not on file   Years of education: Not on file   Highest education level: Not on file  Occupational History   Not on file  Tobacco Use   Smoking status: Every Day    Current packs/day: 0.50    Types: Cigarettes   Smokeless tobacco: Never  Vaping Use   Vaping status: Never Used  Substance and Sexual Activity   Alcohol use: Not Currently   Drug use: Yes    Types: Marijuana   Sexual activity: Not on file  Other Topics Concern   Not on file  Social History Narrative   Not on file   Social Drivers of Health   Financial Resource Strain: Not on file  Food Insecurity: Not on file  Transportation Needs: Not on file  Physical Activity: Not on file  Stress: Not on file  Social Connections: Not on file     Family History: The patient's family history includes Heart attack in her mother; Heart disease in her  mother.  ROS:   Please see the history of present illness.    All other systems reviewed and are negative.  EKGs/Labs/Other Studies Reviewed:    The following studies were reviewed today: I discussed my findings with the patient at length   Recent Labs: No results found for requested labs within last 365 days.  Recent Lipid Panel No results found for: CHOL, TRIG, HDL, CHOLHDL, VLDL, LDLCALC, LDLDIRECT  Physical Exam:    VS:  BP 104/66   Pulse 74   Ht 5' 1 (1.549 m)   Wt 140 lb 9.6 oz (63.8 kg)   SpO2 94%   BMI 26.57 kg/m     Wt Readings from Last 3 Encounters:  08/25/23 140  lb 9.6 oz (63.8 kg)  05/04/23 151 lb (68.5 kg)  03/23/23 150 lb (68 kg)     GEN: Patient is in no acute distress HEENT: Normal NECK: No JVD; No carotid bruits LYMPHATICS: No lymphadenopathy CARDIAC: Hear sounds regular, 2/6 systolic murmur at the apex. RESPIRATORY:  Clear to auscultation without rales, wheezing or rhonchi  ABDOMEN: Soft, non-tender, non-distended MUSCULOSKELETAL:  No edema; No deformity  SKIN: Warm and dry NEUROLOGIC:  Alert and oriented x 3 PSYCHIATRIC:  Normal affect   Signed, Andrea JONELLE Crape, MD  08/25/2023 1:59 PM    [facility]

## 2023-08-25 NOTE — Patient Instructions (Addendum)
 Medication Instructions:   START: Fludrocortisone  1 daily   Other Instructions 1 week Nurse visit for BP log, Vitals, BMP   Lab Work: None Ordered If you have labs (blood work) drawn today and your tests are completely normal, you will receive your results only by: MyChart Message (if you have MyChart) OR A paper copy in the mail If you have any lab test that is abnormal or we need to change your treatment, we will call you to review the results.   Testing/Procedures: We will order CT coronary calcium score. It will cost $99.00 and is not covered by insurance.  Please call to schedule.    [facility] [address] [place], [place] [address]  Stress Echocardiogram Instructions:    1. You may take all of your medications   2. No food, drink or tobacco products four hours prior to your test.  3. Dress prepared to exercise. Best to wear 2 piece outfit and tennis shoes. Shoes must be closed toe.  4. Please bring all current prescription medications.    Follow-Up: At [facility], you and your health needs are our priority.  As part of our continuing mission to provide you with exceptional heart care, we have created designated Provider Care Teams.  These Care Teams include your primary Cardiologist (physician) and Advanced Practice Providers (APPs -  Physician Assistants and Nurse Practitioners) who all work together to provide you with the care you need, when you need it.  We recommend signing up for the patient portal called MyChart.  Sign up information is provided on this After Visit Summary.  MyChart is used to connect with patients for Virtual Visits (Telemedicine).  Patients are able to view lab/test results, encounter notes, upcoming appointments, etc.  Non-urgent messages can be sent to your provider as well.   To learn more about what you can do with MyChart, go to [email or web address].    Your next appointment:   9 month(s)  The format for your next  appointment:   In Person  Provider:   Jennifer Crape, MD

## 2023-08-31 ENCOUNTER — Ambulatory Visit (HOSPITAL_BASED_OUTPATIENT_CLINIC_OR_DEPARTMENT_OTHER)
Admission: RE | Admit: 2023-08-31 | Discharge: 2023-08-31 | Disposition: A | Discharge status: Home / Self Care | Payer: Self-pay | Source: Ambulatory Visit | Attending: Cardiology | Admitting: Cardiology

## 2023-08-31 DIAGNOSIS — E782 Mixed hyperlipidemia: Secondary | ICD-10-CM

## 2023-09-01 ENCOUNTER — Ambulatory Visit: Payer: Self-pay | Admitting: Cardiology

## 2023-09-01 ENCOUNTER — Ambulatory Visit: Attending: Cardiology

## 2023-09-01 VITALS — BP 102/54 | HR 68 | Ht 61.0 in | Wt 143.2 lb

## 2023-09-01 DIAGNOSIS — I959 Hypotension, unspecified: Secondary | ICD-10-CM

## 2023-09-01 DIAGNOSIS — E782 Mixed hyperlipidemia: Secondary | ICD-10-CM

## 2023-09-01 NOTE — Progress Notes (Signed)
   Nurse Visit   Date of Encounter: 09/01/2023 ID: Andrea Singleton, DOB Sep 28, 1980, MRN [id number]  PCP:  Nestor Elston NOVAK, NP    [facility] Providers Cardiologist:  Redell Leiter, MD      Visit Details   VS:  BP (!) 102/54   Pulse 68   Ht 5' 1 (1.549 m)   Wt 143 lb 3.2 oz (65 kg)   SpO2 98%   BMI 27.06 kg/m  , BMI Body mass index is 27.06 kg/m.  Wt Readings from Last 3 Encounters:  09/01/23 143 lb 3.2 oz (65 kg)  08/25/23 140 lb 9.6 oz (63.8 kg)  05/04/23 151 lb (68.5 kg)     Reason for visit: Nurse visit for vital signs after staring Florinef  0.5 mg daily. Performed today: Vitals, Provider consulted:Revankar, and Education Changes (medications, testing, etc.) : No changes at pt reports feeling better with medication change and Bp log was reviewed by Dr. Edwyna. Length of Visit: 15 minutes    Medications Adjustments/Labs and Tests Ordered: No orders of the defined types were placed in this encounter.  No orders of the defined types were placed in this encounter.    Signed, Melene Meissner, RN  09/01/2023 11:29 AM

## 2023-09-01 NOTE — Telephone Encounter (Signed)
Results reviewed with pt as per Dr. Revankar's note.  Pt verbalized understanding and had no additional questions. Routed to PCP.  

## 2023-09-01 NOTE — Telephone Encounter (Signed)
-----   Message from Dwale Revankar sent at 09/01/2023 10:43 AM EDT ----- Elevated score.  Please bring her in for Chem-7 liver lipid check.  Copy primary.  We will probably have to go up on statin. Jennifer JONELLE Crape, MD 09/01/2023 10:43 AM  ----- Message ----- From: Interface, Rad Results In Sent: 08/31/2023   5:55 PM EDT To: Jennifer JONELLE Crape, MD

## 2023-09-02 LAB — LIPID PANEL
Chol/HDL Ratio: 5.2 ratio — ABNORMAL HIGH (ref 0.0–4.4)
Cholesterol, Total: 215 mg/dL — ABNORMAL HIGH (ref 100–199)
HDL: 41 mg/dL (ref >39)
LDL Chol Calc (NIH): 154 mg/dL — ABNORMAL HIGH (ref 0–99)
Triglycerides: 110 mg/dL (ref 0–149)
VLDL Cholesterol Cal: 20 mg/dL (ref 5–40)

## 2023-09-02 LAB — COMPREHENSIVE METABOLIC PANEL WITH GFR
ALT: 11 IU/L (ref 0–32)
AST: 17 IU/L (ref 0–40)
Albumin: 4.4 g/dL (ref 3.9–4.9)
Alkaline Phosphatase: 66 IU/L (ref 44–121)
BUN/Creatinine Ratio: 11 (ref 9–23)
BUN: 9 mg/dL (ref 6–24)
Bilirubin Total: 0.2 mg/dL (ref 0.0–1.2)
CO2: 19 mmol/L — ABNORMAL LOW (ref 20–29)
Calcium: 9.7 mg/dL (ref 8.7–10.2)
Chloride: 102 mmol/L (ref 96–106)
Creatinine, Ser: 0.84 mg/dL (ref 0.57–1.00)
Globulin, Total: 2.3 g/dL (ref 1.5–4.5)
Glucose: 72 mg/dL (ref 70–99)
Potassium: 4.7 mmol/L (ref 3.5–5.2)
Sodium: 139 mmol/L (ref 134–144)
Total Protein: 6.7 g/dL (ref 6.0–8.5)
eGFR: 88 mL/min/1.73 (ref >59)

## 2023-09-03 ENCOUNTER — Ambulatory Visit: Admitting: Cardiology

## 2023-09-07 DIAGNOSIS — Z419 Encounter for procedure for purposes other than remedying health state, unspecified: Secondary | ICD-10-CM | POA: Diagnosis not present

## 2023-09-14 ENCOUNTER — Telehealth: Payer: Self-pay | Admitting: *Deleted

## 2023-09-14 ENCOUNTER — Encounter: Payer: Self-pay | Admitting: *Deleted

## 2023-09-14 NOTE — Telephone Encounter (Signed)
 Stress echo instructions sent via [employer].

## 2023-09-28 ENCOUNTER — Ambulatory Visit

## 2023-09-29 NOTE — Addendum Note (Signed)
 Addended by: ONEITA BERLINER on: 09/29/2023 09:29 AM   Modules accepted: Orders

## 2023-09-29 NOTE — Addendum Note (Signed)
 Addended by: Sasuke Yaffe R on: 09/29/2023 11:54 AM   Modules accepted: Orders

## 2023-10-08 DIAGNOSIS — Z419 Encounter for procedure for purposes other than remedying health state, unspecified: Secondary | ICD-10-CM | POA: Diagnosis not present

## 2023-10-15 ENCOUNTER — Ambulatory Visit: Attending: Cardiology

## 2023-10-15 DIAGNOSIS — R0609 Other forms of dyspnea: Secondary | ICD-10-CM | POA: Diagnosis not present

## 2023-10-15 LAB — EXERCISE TOLERANCE TEST
Angina Index: 0
Estimated workload: 8.3
Exercise duration (min): 6 min
Exercise duration (sec): 27 s
MPHR: 175 {beats}/min
Peak HR: 151 {beats}/min
Percent HR: 86 %
RPE: 17
Rest HR: 67 {beats}/min

## 2023-10-21 ENCOUNTER — Encounter: Payer: Self-pay | Admitting: Cardiology

## 2023-10-25 ENCOUNTER — Telehealth: Payer: Self-pay | Admitting: Cardiology

## 2023-10-25 DIAGNOSIS — E782 Mixed hyperlipidemia: Secondary | ICD-10-CM

## 2023-10-25 NOTE — Telephone Encounter (Signed)
 Patient calling for results to stress test that was done on 9/19.

## 2023-10-27 DIAGNOSIS — E162 Hypoglycemia, unspecified: Secondary | ICD-10-CM | POA: Diagnosis not present

## 2023-10-27 DIAGNOSIS — E782 Mixed hyperlipidemia: Secondary | ICD-10-CM | POA: Diagnosis not present

## 2023-10-28 NOTE — Telephone Encounter (Signed)
Results reviewed with pt as per Dr. Revankar's note.  Pt verbalized understanding and had no additional questions. Routed to PCP.  

## 2023-11-02 DIAGNOSIS — K219 Gastro-esophageal reflux disease without esophagitis: Secondary | ICD-10-CM | POA: Diagnosis not present

## 2023-11-03 DIAGNOSIS — E782 Mixed hyperlipidemia: Secondary | ICD-10-CM | POA: Diagnosis not present

## 2023-11-03 DIAGNOSIS — E559 Vitamin D deficiency, unspecified: Secondary | ICD-10-CM | POA: Diagnosis not present

## 2023-11-03 DIAGNOSIS — K219 Gastro-esophageal reflux disease without esophagitis: Secondary | ICD-10-CM | POA: Diagnosis not present

## 2023-11-03 DIAGNOSIS — Z90710 Acquired absence of both cervix and uterus: Secondary | ICD-10-CM | POA: Diagnosis not present

## 2023-11-03 DIAGNOSIS — E0590 Thyrotoxicosis, unspecified without thyrotoxic crisis or storm: Secondary | ICD-10-CM | POA: Diagnosis not present

## 2023-11-03 DIAGNOSIS — F319 Bipolar disorder, unspecified: Secondary | ICD-10-CM | POA: Diagnosis not present

## 2023-11-03 DIAGNOSIS — I959 Hypotension, unspecified: Secondary | ICD-10-CM | POA: Diagnosis not present

## 2023-11-03 DIAGNOSIS — Z1321 Encounter for screening for nutritional disorder: Secondary | ICD-10-CM | POA: Diagnosis not present

## 2023-11-03 DIAGNOSIS — Z0000 Encounter for general adult medical examination without abnormal findings: Secondary | ICD-10-CM | POA: Diagnosis not present

## 2023-11-03 DIAGNOSIS — Z13228 Encounter for screening for other metabolic disorders: Secondary | ICD-10-CM | POA: Diagnosis not present

## 2023-11-03 DIAGNOSIS — R7303 Prediabetes: Secondary | ICD-10-CM | POA: Insufficient documentation

## 2023-11-17 ENCOUNTER — Other Ambulatory Visit

## 2023-11-24 DIAGNOSIS — F1290 Cannabis use, unspecified, uncomplicated: Secondary | ICD-10-CM | POA: Diagnosis not present

## 2023-11-24 DIAGNOSIS — I471 Supraventricular tachycardia, unspecified: Secondary | ICD-10-CM | POA: Diagnosis not present

## 2023-11-24 DIAGNOSIS — R195 Other fecal abnormalities: Secondary | ICD-10-CM | POA: Diagnosis not present

## 2023-11-24 DIAGNOSIS — F17210 Nicotine dependence, cigarettes, uncomplicated: Secondary | ICD-10-CM | POA: Diagnosis not present

## 2023-11-24 DIAGNOSIS — K219 Gastro-esophageal reflux disease without esophagitis: Secondary | ICD-10-CM | POA: Diagnosis not present

## 2023-11-24 DIAGNOSIS — Z880 Allergy status to penicillin: Secondary | ICD-10-CM | POA: Diagnosis not present

## 2023-11-24 DIAGNOSIS — K2101 Gastro-esophageal reflux disease with esophagitis, with bleeding: Secondary | ICD-10-CM | POA: Diagnosis not present

## 2023-11-24 DIAGNOSIS — K222 Esophageal obstruction: Secondary | ICD-10-CM | POA: Diagnosis not present

## 2023-11-24 DIAGNOSIS — Z1211 Encounter for screening for malignant neoplasm of colon: Secondary | ICD-10-CM | POA: Diagnosis not present

## 2023-11-24 DIAGNOSIS — K449 Diaphragmatic hernia without obstruction or gangrene: Secondary | ICD-10-CM | POA: Diagnosis not present

## 2023-11-24 DIAGNOSIS — R1310 Dysphagia, unspecified: Secondary | ICD-10-CM | POA: Diagnosis not present

## 2023-11-24 DIAGNOSIS — K648 Other hemorrhoids: Secondary | ICD-10-CM | POA: Diagnosis not present

## 2023-11-24 DIAGNOSIS — R110 Nausea: Secondary | ICD-10-CM | POA: Diagnosis not present

## 2023-11-24 DIAGNOSIS — Z79899 Other long term (current) drug therapy: Secondary | ICD-10-CM | POA: Diagnosis not present

## 2023-11-24 DIAGNOSIS — R112 Nausea with vomiting, unspecified: Secondary | ICD-10-CM | POA: Diagnosis not present

## 2023-11-25 LAB — LIPID PANEL

## 2023-11-26 ENCOUNTER — Ambulatory Visit: Payer: Self-pay | Admitting: Cardiology

## 2023-11-26 DIAGNOSIS — E782 Mixed hyperlipidemia: Secondary | ICD-10-CM

## 2023-11-26 LAB — LIPID PANEL
Chol/HDL Ratio: 4.6 ratio — ABNORMAL HIGH (ref 0.0–4.4)
Cholesterol, Total: 170 mg/dL (ref 100–199)
HDL: 37 mg/dL — ABNORMAL LOW (ref >39)
LDL Chol Calc (NIH): 118 mg/dL — ABNORMAL HIGH (ref 0–99)
Triglycerides: 78 mg/dL (ref 0–149)
VLDL Cholesterol Cal: 15 mg/dL (ref 5–40)

## 2023-11-26 LAB — HEPATIC FUNCTION PANEL
ALT: 13 IU/L (ref 0–32)
AST: 14 IU/L (ref 0–40)
Albumin: 4.1 g/dL (ref 3.9–4.9)
Alkaline Phosphatase: 67 IU/L (ref 41–116)
Bilirubin Total: 0.3 mg/dL (ref 0.0–1.2)
Bilirubin, Direct: 0.1 mg/dL (ref 0.00–0.40)
Total Protein: 6.2 g/dL (ref 6.0–8.5)

## 2023-11-29 MED ORDER — ROSUVASTATIN CALCIUM 20 MG PO TABS: 20.0000 mg | ORAL_TABLET | Freq: Every day | ORAL | 3 refills | Status: AC

## 2023-11-29 NOTE — Telephone Encounter (Signed)
 MyChart message  Orders placed

## 2023-12-08 NOTE — Progress Notes (Signed)
 Cardiology Office Note:    Date:  12/09/2023   ID:  Andrea Singleton, DOB 08-30-1980, MRN [id number]  PCP:  Duwaine Burnard Amble, NP  Cardiologist:  Redell Leiter, MD    Referring MD: Duwaine Burnard Amble, NP    ASSESSMENT:    1. 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 List of blood pressures in a few weeks if she is recovered I will stop the Florinef  and will titrate midodrine to at least dosage to avoid symptoms to keep blood pressure greater than 100 systolic   Next appointment: 3 months   Medication Adjustments/Labs and Tests Ordered: Current medicines are reviewed at length with the patient today.  Concerns regarding medicines are outlined above.  Orders Placed This Encounter  Procedures   Basic metabolic panel with GFR   Cortisol   Meds ordered this encounter  Medications   midodrine (PROAMATINE) 5 MG tablet    Sig: Take 1 tablet (5 mg total) by mouth 3 (three) times daily with meals.    Dispense:  270 tablet    Refill:  3     History of Present Illness:    Andrea Singleton is a 43 y.o. female with a hx of SVT status post EP ablation and hyperlipidemia last seen by me 10/20/2022.  When seen on several occasions my office mostly Delon Hoover nurse practitioner with an episode of syncope.  Compliance with diet, lifestyle and medications: Yes  There is a complicated visit she has had multiple visits the central problem is weakness and  hypotension. In one of them she had a coronary calcium score done elevated start a statin well-tolerated Subsequent placed on a minimum dose of Florinef  and she is a little better but is still having weakness lightheadedness and blood pressure in the 80s She has a history of back surgery and all this was preceded by several weeks of persistent diarrhea He had salt and water to her diet She works as a child psychotherapist Today blood pressure is 92/50 in both arms standing 80 over 51-minute 82/50 with weakness  Past Medical History:  Diagnosis Date   Acquired hypothyroidism 10/15/2022   Anxiety    Bilateral hand numbness 10/15/2022   Bipolar disorder (HCC)    Carpal tunnel syndrome, right 10/15/2022   Depression    Dysrhythmia    SVT s/p ablation 2004   Elevated white blood cell count, unspecified    GERD (gastroesophageal reflux disease)    HNP (herniated nucleus pulposus), lumbar 05/30/2021   Hyperlipidemia    Hyperthyroidism    Neck pain 10/15/2022   SVT (supraventricular tachycardia)    Thyrotoxicosis    Tricuspid regurgitation    Vitamin D deficiency     Current Medications: Current Meds  Medication Sig   ARIPiprazole  (ABILIFY ) 30 MG tablet Take 1 tablet by mouth daily.   Calcium Polycarbophil (FIBER) 625 MG TABS Take 2 tablets by mouth 2 (two) times daily.   EPINEPHrine  0.3 mg/0.3 mL IJ SOAJ injection Inject 0.3 mg into  the muscle as needed for anaphylaxis.   fludrocortisone  (FLORINEF ) 0.1 MG tablet Take 0.5 tablets (0.05 mg total) by mouth daily.   meclizine (ANTIVERT) 12.5 MG tablet Take 12.5 mg by mouth 3 (three) times daily as needed for dizziness.   methimazole  (TAPAZOLE ) 5 MG tablet Take 5 mg by mouth daily.   midodrine (PROAMATINE) 5 MG tablet Take 1 tablet (5 mg total) by mouth 3 (three) times daily with meals.   pantoprazole (PROTONIX) 40 MG tablet Take 40 mg by mouth daily.   rosuvastatin (CRESTOR) 20 MG tablet Take 1 tablet (20 mg total) by mouth daily.   sertraline  (ZOLOFT) 25 MG tablet Take 25 mg by mouth daily.   VENTOLIN HFA 108 (90 Base) MCG/ACT inhaler as directed.   [DISCONTINUED] APPLE CIDER VINEGAR PO Take 450 mg by mouth daily.   [DISCONTINUED] Multiple Vitamins-Minerals (MULTIVITAMIN WITH MINERALS) tablet Take 1 tablet by mouth daily.      EKGs/Labs/Other Studies Reviewed:    The following studies were reviewed today:  Cardiac Studies & Procedures   ______________________________________________________________________________________________   STRESS TESTS  EXERCISE TOLERANCE TEST (ETT) 10/15/2023  Interpretation Summary   up sloping ST depression in the inferolateral leads (II, III, aVF, V5 and V6) was noted. The ECG was borderline for ischemia.   ECHOCARDIOGRAM  ECHOCARDIOGRAM COMPLETE 03/24/2023  Narrative ECHOCARDIOGRAM REPORT    Patient Name:   Andrea Singleton Date of Exam: 03/24/2023 Medical Rec #:  [id number]         Height:       61.0 in Accession #:    [id number]        Weight:       150.0 lb Date of Birth:  04/12/1980         BSA:          1.671 m Patient Age:    42 years          BP:           80/60 mmHg Patient Gender: F                 HR:           65 bpm. Exam Location:  [facility]  Procedure: 2D Echo, 3D Echo, Cardiac Doppler, Color Doppler and Strain Analysis (Both Spectral and Color Flow Doppler were utilized during procedure).  Indications:    Status post catheter ablation of slow pathway [Z98.890, Z86.79 (ICD-10-CM)]; Palpitations [R00.2 (ICD-10-CM)]  History:        Patient has prior history of Echocardiogram examinations, most recent 11/05/2020. Arrythmias:SVT, Signs/Symptoms:Syncope and Murmur; Risk Factors:Dyslipidemia and Hypertension.  Sonographer:    Charlie Jointer RDCS Referring Phys: [phone] JENNIFER C WOODY  IMPRESSIONS   1. Left ventricular ejection fraction, by estimation, is 60 to 65%. Left ventricular ejection fraction by 3D volume is 60 %. The left ventricle has normal function.  The left ventricle has no regional wall motion abnormalities. Left ventricular diastolic parameters were normal. The average left ventricular global longitudinal strain is -20.4 %. The global longitudinal strain is normal. 2. Right ventricular systolic function is normal. The right ventricular size is normal. There is normal pulmonary artery systolic pressure. 3. The mitral valve is normal in structure. No evidence of mitral valve regurgitation. No evidence of mitral stenosis. 4. The aortic valve is tricuspid. Aortic valve regurgitation is not visualized. No aortic stenosis is present. 5. The inferior vena cava is normal in size with greater than 50% respiratory variability, suggesting right atrial pressure of 3  mmHg.  FINDINGS Left Ventricle: Left ventricular ejection fraction, by estimation, is 60 to 65%. Left ventricular ejection fraction by 3D volume is 60 %. The left ventricle has normal function. The left ventricle has no regional wall motion abnormalities. The average left ventricular global longitudinal strain is -20.4 %. Strain was performed and the global longitudinal strain is normal. The left ventricular internal cavity size was normal in size. There is no left ventricular hypertrophy. Left ventricular diastolic parameters were normal. Normal left ventricular filling pressure.  Right Ventricle: The right ventricular size is normal. No increase in right ventricular wall thickness. Right ventricular systolic function is normal. There is normal pulmonary artery systolic pressure. The tricuspid regurgitant velocity is 2.46 m/s, and with an assumed right atrial pressure of 3 mmHg, the estimated right ventricular systolic pressure is 27.2 mmHg.  Left Atrium: Left atrial size was normal in size.  Right Atrium: Right atrial size was normal in size.  Pericardium: There is no evidence of pericardial effusion.  Mitral Valve: The mitral valve is normal in structure. No evidence of mitral valve  regurgitation. No evidence of mitral valve stenosis.  Tricuspid Valve: The tricuspid valve is normal in structure. Tricuspid valve regurgitation is mild . No evidence of tricuspid stenosis.  Aortic Valve: The aortic valve is tricuspid. Aortic valve regurgitation is not visualized. No aortic stenosis is present. Aortic valve mean gradient measures 6.7 mmHg. Aortic valve peak gradient measures 12.0 mmHg. Aortic valve area, by VTI measures 1.57 cm.  Pulmonic Valve: The pulmonic valve was normal in structure. Pulmonic valve regurgitation is not visualized. No evidence of pulmonic stenosis.  Aorta: The aortic root, ascending aorta, aortic arch and descending aorta are all structurally normal, with no evidence of dilitation or obstruction.  Venous: A normal flow pattern is recorded from the right upper pulmonary vein. The inferior vena cava is normal in size with greater than 50% respiratory variability, suggesting right atrial pressure of 3 mmHg.  IAS/Shunts: No atrial level shunt detected by color flow Doppler.  Additional Comments: 3D was performed not requiring image post processing on an independent workstation and was normal.   LEFT VENTRICLE PLAX 2D LVIDd:         4.50 cm         Diastology LVIDs:         3.20 cm         LV e' medial:    13.85 cm/s LV PW:         0.80 cm         LV E/e' medial:  6.5 LV IVS:        0.90 cm         LV e' lateral:   19.85 cm/s LVOT diam:     1.70 cm         LV E/e' lateral: 4.5 LV SV:         58 LV SV Index:   35              2D LVOT Area:     2.27 cm        Longitudinal Strain 2D Strain GLS  -19.3 % (A2C): 2D Strain GLS  -21.8 % (A3C): 2D Strain GLS  -20.0 % (A4C): 2D Strain GLS  -20.4 % Avg:  3D Volume EF LV 3D EF:    Left ventricul ar ejection fraction by 3D volume is 60 %.  3D Volume EF: 3D EF:        60 %  LV EDV:       120 ml LV ESV:       48 ml LV SV:        72 ml  RIGHT VENTRICLE             IVC RV Basal diam:  3.40 cm      IVC diam: 1.70 cm RV Mid diam:    2.80 cm RV S prime:     15.65 cm/s TAPSE (M-mode): 2.9 cm  LEFT ATRIUM             Index        RIGHT ATRIUM           Index LA diam:        2.70 cm 1.62 cm/m   RA Area:     13.70 cm LA Vol (A2C):   33.1 ml 19.80 ml/m  RA Volume:   32.20 ml  19.27 ml/m LA Vol (A4C):   47.2 ml 28.24 ml/m LA Biplane Vol: 41.5 ml 24.83 ml/m AORTIC VALVE AV Area (Vmax):    1.57 cm AV Area (Vmean):   1.60 cm AV Area (VTI):     1.57 cm AV Vmax:           173.33 cm/s AV Vmean:          114.333 cm/s AV VTI:            0.367 m AV Peak Grad:      12.0 mmHg AV Mean Grad:      6.7 mmHg LVOT Vmax:         120.00 cm/s LVOT Vmean:        80.400 cm/s LVOT VTI:          0.254 m LVOT/AV VTI ratio: 0.69  AORTA Ao Root diam: 2.50 cm Ao Asc diam:  2.30 cm Ao Desc diam: 1.60 cm  MITRAL VALVE               TRICUSPID VALVE MV Area (PHT): 4.18 cm    TR Peak grad:   24.2 mmHg MV Decel Time: 181 msec    TR Vmax:        246.00 cm/s MV E velocity: 89.87 cm/s MV A velocity: 51.83 cm/s  SHUNTS MV E/A ratio:  1.73        Systemic VTI:  0.25 m Systemic Diam: 1.70 cm  Redell Leiter MD Electronically signed by Redell Leiter MD Signature Date/Time: 03/24/2023/12:39:21 PM    Final      CT SCANS  CT CARDIAC SCORING (SELF PAY ONLY) 08/31/2023  Addendum 09/06/2023  6:28 PM ADDENDUM REPORT: 09/06/2023 18:26  EXAM: OVER-READ INTERPRETATION  CT CHEST  The following report is an over-read performed by radiologist Dr. Andrea Gasman of [facility] on 09/06/2023. This over-read does not include interpretation of cardiac or coronary anatomy or pathology. The coronary calcium score interpretation by the cardiologist is attached.  COMPARISON:  None.  FINDINGS: Vascular: No aortic atherosclerosis. The included aorta is normal in caliber.  Mediastinum/nodes: No adenopathy or mass. Unremarkable esophagus.  Lungs: No focal airspace disease. No pulmonary nodule. No  pleural fluid. The included airways are patent.  Upper abdomen: No acute or unexpected findings.  Musculoskeletal: There are no acute or suspicious osseous abnormalities.  IMPRESSION: No significant extracardiac findings.   Electronically Signed By: Andrea Gasman M.D. On: 09/06/2023 18:26  Narrative : CLINICAL DATA:  Cardiovascular Disease Risk stratification  EXAM:  Coronary Calcium Score  TECHNIQUE:  A  gated, non-contrast computed tomography scan of the heart was  performed using 3mm slice thickness. Axial images were analyzed on a  dedicated workstation. Calcium scoring of the coronary arteries was  performed using the Agatston method.  FINDINGS:  Coronary Calcium Score:  Left main: 0  Left anterior descending artery: 0  Left circumflex artery: 0  Right coronary artery: 10.2  Total: 10.2  Pericardium: Normal.  Ascending Aorta: Normal caliber.  Pulmonary artery: Normal caliber  Non-cardiac: See separate report from [facility].  IMPRESSION:  Coronary calcium score of 10.2.  No data for this age group.  RECOMMENDATIONS:  Coronary artery calcium (CAC) score is a strong predictor of  incident coronary heart disease (CHD) and provides predictive  information beyond traditional risk factors. CAC scoring is  reasonable to use in the decision to withhold, postpone, or initiate  statin therapy in intermediate-risk or selected borderline-risk  asymptomatic adults (age 11-75 years and LDL-C >=70 to <190 mg/dL)  who do not have diabetes or established atherosclerotic  cardiovascular disease (ASCVD).* In intermediate-risk (10-year ASCVD  risk >=7.5% to <20%) adults or selected borderline-risk (10-year  ASCVD risk >=5% to <7.5%) adults in whom a CAC score is measured for  the purpose of making a treatment decision the following  recommendations have been made:  If CAC=0, it is reasonable to withhold statin therapy and  reassess  in 5 to 10 years, as long as higher risk conditions are absent  (diabetes mellitus, family history of premature CHD in first degree  relatives (males <55 years; females <65 years), cigarette smoking,  or LDL >=190 mg/dL).  If CAC is 1 to 99, it is reasonable to initiate statin therapy for  patients >=63 years of age.  If CAC is >=100 or >=75th percentile, it is reasonable to initiate  statin therapy at any age.  Cardiology referral should be considered for patients with CAC  scores >=400 or >=75th percentile.  *2018 AHA/ACC/AACVPR/AAPA/ABC/ACPM/ADA/AGS/APhA/ASPC/NLA/PCNA  Guideline on the Management of Blood Cholesterol: A Report of the  American College of Cardiology/American Heart Association Task Force  on Clinical Practice Guidelines. J Am Coll Cardiol.  2019;73(24):3168-3209.  Electronically Signed: By: Lamar Fitch M.D. On: 08/31/2023 17:52     ______________________________________________________________________________________________          Recent Labs: 09/01/2023: BUN 9; Creatinine, Ser 0.84; Potassium 4.7; Sodium 139 11/25/2023: ALT 13  Recent Lipid Panel    Component Value Date/Time   CHOL 170 11/25/2023 0813   TRIG 78 11/25/2023 0813   HDL 37 (L) 11/25/2023 0813   CHOLHDL 4.6 (H) 11/25/2023 0813   LDLCALC 118 (H) 11/25/2023 0813    Physical Exam:    VS:  BP 104/64   Pulse 84   Ht 5' 1 (1.549 m)   Wt 148 lb 6.4 oz (67.3 kg)   SpO2 97%   BMI 28.04 kg/m     Wt Readings from Last 3 Encounters:  12/09/23 148 lb 6.4 oz (67.3 kg)  09/01/23 143 lb 3.2 oz (65 kg)  08/25/23 140 lb 9.6 oz (63.8 kg)     GEN:  Well nourished, well developed in no acute distress HEENT: Normal NECK: No JVD; No carotid bruits LYMPHATICS: No lymphadenopathy CARDIAC: RRR, no murmurs, rubs, gallops RESPIRATORY:  Clear to auscultation without rales, wheezing or rhonchi  ABDOMEN: Soft, non-tender, non-distended MUSCULOSKELETAL:  No edema; No  deformity  SKIN: Warm and dry NEUROLOGIC:  Alert and oriented x 3 PSYCHIATRIC:  Normal affect    Signed, Redell Leiter, MD  12/09/2023 11:59 AM    [facility]

## 2023-12-09 ENCOUNTER — Ambulatory Visit: Attending: Cardiology | Admitting: Cardiology

## 2023-12-09 ENCOUNTER — Encounter: Payer: Self-pay | Admitting: Cardiology

## 2023-12-09 VITALS — BP 104/64 | HR 84 | Ht 61.0 in | Wt 148.4 lb

## 2023-12-09 DIAGNOSIS — I959 Hypotension, unspecified: Secondary | ICD-10-CM | POA: Insufficient documentation

## 2023-12-09 MED ORDER — MIDODRINE HCL 5 MG PO TABS: 5.0000 mg | ORAL_TABLET | Freq: Three times a day (TID) | ORAL | 3 refills | Status: AC

## 2023-12-09 NOTE — Patient Instructions (Signed)
 Medication Instructions:  Your physician has recommended you make the following change in your medication:   Start Midodrine 5 mg three times daily. Check BP twice daily sitting/standing, record and send in 2 weeks.  *If you need a refill on your cardiac medications before your next appointment, please call your pharmacy*   Lab Work: Your physician recommends that you return for lab work in: 1 week for fasting cortisol and BMP You need to have labs done when you are fasting.  You can come Monday through Friday 8:30 am to 12:00 pm and 1:15 to 4:30. You do not need to make an appointment as the order has already been placed.   If you have labs (blood work) drawn today and your tests are completely normal, you will receive your results only by: MyChart Message (if you have MyChart) OR A paper copy in the mail If you have any lab test that is abnormal or we need to change your treatment, we will call you to review the results.   Testing/Procedures: None ordered   Follow-Up: At [facility], you and your health needs are our priority.  As part of our continuing mission to provide you with exceptional heart care, we have created designated Provider Care Teams.  These Care Teams include your primary Cardiologist (physician) and Advanced Practice Providers (APPs -  Physician Assistants and Nurse Practitioners) who all work together to provide you with the care you need, when you need it.  We recommend signing up for the patient portal called MyChart.  Sign up information is provided on this After Visit Summary.  MyChart is used to connect with patients for Virtual Visits (Telemedicine).  Patients are able to view lab/test results, encounter notes, upcoming appointments, etc.  Non-urgent messages can be sent to your provider as well.   To learn more about what you can do with MyChart, go to [email or web address].    Your next appointment:   3 month(s)  The format for your next  appointment:   In Person  Provider:   Redell Leiter, MD    Other Instructions none  Important Information About Sugar

## 2023-12-14 DIAGNOSIS — I959 Hypotension, unspecified: Secondary | ICD-10-CM | POA: Diagnosis not present

## 2023-12-15 ENCOUNTER — Ambulatory Visit: Payer: Self-pay | Admitting: Cardiology

## 2023-12-15 LAB — CORTISOL: Cortisol: 12.2 ug/dL (ref 6.2–19.4)

## 2023-12-15 LAB — BASIC METABOLIC PANEL WITH GFR
BUN/Creatinine Ratio: 11 (ref 9–23)
BUN: 11 mg/dL (ref 6–24)
CO2: 23 mmol/L (ref 20–29)
Calcium: 9.3 mg/dL (ref 8.7–10.2)
Chloride: 104 mmol/L (ref 96–106)
Creatinine, Ser: 1.01 mg/dL — ABNORMAL HIGH (ref 0.57–1.00)
Glucose: 136 mg/dL — ABNORMAL HIGH (ref 70–99)
Potassium: 4.4 mmol/L (ref 3.5–5.2)
Sodium: 140 mmol/L (ref 134–144)
eGFR: 70 mL/min/1.73 (ref >59)

## 2023-12-21 DIAGNOSIS — Z1231 Encounter for screening mammogram for malignant neoplasm of breast: Secondary | ICD-10-CM | POA: Diagnosis not present

## 2023-12-25 ENCOUNTER — Ambulatory Visit (HOSPITAL_BASED_OUTPATIENT_CLINIC_OR_DEPARTMENT_OTHER)
Admission: EM | Admit: 2023-12-25 | Discharge: 2023-12-25 | Disposition: A | Discharge status: Home / Self Care | Attending: Family Medicine | Admitting: Family Medicine

## 2023-12-25 ENCOUNTER — Encounter (HOSPITAL_BASED_OUTPATIENT_CLINIC_OR_DEPARTMENT_OTHER): Payer: Self-pay | Admitting: Emergency Medicine

## 2023-12-25 DIAGNOSIS — J208 Acute bronchitis due to other specified organisms: Secondary | ICD-10-CM

## 2023-12-25 DIAGNOSIS — R051 Acute cough: Secondary | ICD-10-CM | POA: Diagnosis not present

## 2023-12-25 LAB — POC COVID19/FLU A&B COMBO
Covid Antigen, POC: NEGATIVE
Influenza A Antigen, POC: NEGATIVE
Influenza B Antigen, POC: NEGATIVE

## 2023-12-25 MED ORDER — PREDNISONE 20 MG PO TABS: 20.0000 mg | ORAL_TABLET | Freq: Every day | ORAL | 0 refills | Status: AC

## 2023-12-25 MED ORDER — BENZONATATE 200 MG PO CAPS: 200.0000 mg | ORAL_CAPSULE | Freq: Three times a day (TID) | ORAL | 0 refills | Status: AC | PRN

## 2023-12-25 NOTE — ED Provider Notes (Signed)
 [facility]    CSN: [id number] Arrival date & time: 12/25/23  1410      History   Chief Complaint Chief Complaint  Patient presents with   Cough   Fever   Generalized Body Aches    HPI Andrea Singleton is a 43 y.o. female.   43 year old female with report of acute cough, headache, body aches, nasal congestion, sneezing and fever of 100.4 today.  All the symptoms but the fever started on 12/23/2023.  She is been exposed to at least two people who have acute viral bronchitis in the last 3 days.   Cough Associated symptoms: fever, headaches and rhinorrhea   Associated symptoms: no chest pain, no chills, no ear pain, no rash, no shortness of breath and no sore throat   Fever Associated symptoms: congestion, cough, headaches and rhinorrhea   Associated symptoms: no chest pain, no chills, no diarrhea, no dysuria, no ear pain, no nausea, no rash, no sore throat and no vomiting     Past Medical History:  Diagnosis Date   Acquired hypothyroidism 10/15/2022   Anxiety    Bilateral hand numbness 10/15/2022   Bipolar disorder (HCC)    Carpal tunnel syndrome, right 10/15/2022   Depression    Dysrhythmia    SVT s/p ablation 2004   Elevated white blood cell count, unspecified    GERD (gastroesophageal reflux disease)    HNP (herniated nucleus pulposus), lumbar 05/30/2021   Hyperlipidemia    Hyperthyroidism    Neck pain 10/15/2022   SVT (supraventricular tachycardia)    Thyrotoxicosis    Tricuspid regurgitation    Vitamin D deficiency     Patient Active Problem List   Diagnosis Date Noted   History of hysterectomy 11/03/2023   Prediabetes 11/03/2023   DOE (dyspnea on exertion) 08/25/2023   Cigarette smoker 08/25/2023   Tricuspid regurgitation    SVT (supraventricular tachycardia)    Hyperthyroidism    Acquired hypothyroidism 10/15/2022   Bilateral hand numbness 10/15/2022   Carpal tunnel syndrome, right 10/15/2022   Neck pain 10/15/2022   Vitamin D  deficiency 06/01/2022   Hyperlipidemia 06/01/2022   GERD (gastroesophageal reflux disease) 06/01/2022   Elevated white blood cell count, unspecified 06/01/2022   Dysrhythmia 06/01/2022   Depression 06/01/2022   Bipolar disorder (HCC) 06/01/2022   Anxiety 06/01/2022   HNP (herniated nucleus pulposus), lumbar 05/30/2021    Past Surgical History:  Procedure Laterality Date   ABDOMINAL HYSTERECTOMY  07/2006   ABLATION  2004   2 Catheter Ablations for SVT's, [facility]   KNEE ARTHROSCOPY Left    LUMBAR LAMINECTOMY/DECOMPRESSION MICRODISCECTOMY Right 05/30/2021   Procedure: Microdiscectomy L5-S1 Right;  Surgeon: Duwayne Purchase, MD;  Location: [facility] OR;  Service: Orthopedics;  Laterality: Right;  2 hrs 3 C-Bed   WRIST SURGERY Right 10/15/2020   1st Extensor Compartment Release    OB History   No obstetric history on file.      Home Medications    Prior to Admission medications   Medication Sig Start Date End Date Taking? Authorizing Provider  ARIPiprazole  (ABILIFY ) 30 MG tablet Take 1 tablet by mouth daily.   Yes [provider]  benzonatate (TESSALON) 200 MG capsule Take 1 capsule (200 mg total) by mouth 3 (three) times daily as needed for cough. 12/25/23  Yes Ival Domino, FNP  fludrocortisone  (FLORINEF ) 0.1 MG tablet Take 0.5 tablets (0.05 mg total) by mouth daily. 08/25/23  Yes Revankar, Jennifer SAUNDERS, MD  midodrine  (PROAMATINE ) 5 MG tablet Take 1  tablet (5 mg total) by mouth 3 (three) times daily with meals. 12/09/23  Yes Monetta Redell PARAS, MD  pantoprazole (PROTONIX) 40 MG tablet Take 40 mg by mouth daily. 06/08/23  Yes [provider]  predniSONE (DELTASONE) 20 MG tablet Take 1 tablet (20 mg total) by mouth daily with breakfast for 5 days. 12/25/23 12/30/23 Yes Ival Domino, FNP  rosuvastatin  (CRESTOR ) 20 MG tablet Take 1 tablet (20 mg total) by mouth daily. 11/29/23 02/27/24 Yes Revankar, Jennifer SAUNDERS, MD  sertraline (ZOLOFT) 25 MG tablet Take 25 mg by mouth daily.  01/08/23  Yes [provider]  VENTOLIN HFA 108 (90 Base) MCG/ACT inhaler as directed. 09/09/23  Yes [provider]  Calcium  Polycarbophil (FIBER) 625 MG TABS Take 2 tablets by mouth 2 (two) times daily.    [provider]  EPINEPHrine  0.3 mg/0.3 mL IJ SOAJ injection Inject 0.3 mg into the muscle as needed for anaphylaxis. 09/03/20   [provider]  meclizine (ANTIVERT) 12.5 MG tablet Take 12.5 mg by mouth 3 (three) times daily as needed for dizziness.    [provider]  methimazole  (TAPAZOLE ) 5 MG tablet Take 5 mg by mouth daily.    [provider]    Family History Family History  Problem Relation Age of Onset   Heart disease Mother    Heart attack Mother        X2    Social History Social History   Tobacco Use   Smoking status: Every Day    Current packs/day: 0.50    Types: Cigarettes   Smokeless tobacco: Never  Vaping Use   Vaping status: Never Used  Substance Use Topics   Alcohol use: Not Currently   Drug use: Yes    Types: Marijuana     Allergies   Daucus carota and Penicillins   Review of Systems Review of Systems  Constitutional:  Positive for fever. Negative for chills.  HENT:  Positive for congestion, postnasal drip, rhinorrhea and sneezing. Negative for ear pain and sore throat.   Eyes:  Negative for pain and visual disturbance.  Respiratory:  Positive for cough. Negative for shortness of breath.   Cardiovascular:  Negative for chest pain and palpitations.  Gastrointestinal:  Negative for abdominal pain, constipation, diarrhea, nausea and vomiting.  Genitourinary:  Negative for dysuria and hematuria.  Musculoskeletal:  Positive for arthralgias. Negative for back pain.  Skin:  Negative for color change and rash.  Neurological:  Positive for headaches. Negative for seizures and syncope.  All other systems reviewed and are negative.    Physical Exam Triage Vital Signs ED Triage Vitals  Encounter  Vitals Group     BP 12/25/23 1540 103/69     Girls Systolic BP Percentile --      Girls Diastolic BP Percentile --      Boys Systolic BP Percentile --      Boys Diastolic BP Percentile --      Pulse Rate 12/25/23 1540 75     Resp 12/25/23 1540 18     Temp 12/25/23 1540 98.4 F (36.9 C)     Temp Source 12/25/23 1540 Oral     SpO2 12/25/23 1540 95 %     Weight --      Height --      Head Circumference --      Peak Flow --      Pain Score 12/25/23 1539 0     Pain Loc --      Pain  Education --      Exclude from [employer] Chart --    No data found.  Updated Vital Signs BP 103/69 (BP Location: Right Arm)   Pulse 75   Temp 98.4 F (36.9 C) (Oral)   Resp 18   SpO2 95%   Visual Acuity Right Eye Distance:   Left Eye Distance:   Bilateral Distance:    Right Eye Near:   Left Eye Near:    Bilateral Near:     Physical Exam Vitals and nursing note reviewed.  Constitutional:      General: She is not in acute distress.    Appearance: She is well-developed. She is ill-appearing. She is not toxic-appearing or diaphoretic.  HENT:     Head: Normocephalic and atraumatic.     Right Ear: Hearing, tympanic membrane, ear canal and external ear normal.     Left Ear: Hearing, tympanic membrane, ear canal and external ear normal.     Nose: Congestion and rhinorrhea present. Rhinorrhea is clear.     Right Sinus: No maxillary sinus tenderness or frontal sinus tenderness.     Left Sinus: No maxillary sinus tenderness or frontal sinus tenderness.     Mouth/Throat:     Lips: Pink.     Mouth: Mucous membranes are moist.     Pharynx: Uvula midline. No oropharyngeal exudate or posterior oropharyngeal erythema.     Tonsils: No tonsillar exudate.  Eyes:     Conjunctiva/sclera: Conjunctivae normal.     Pupils: Pupils are equal, round, and reactive to light.  Cardiovascular:     Rate and Rhythm: Normal rate and regular rhythm.     Heart sounds: S1 normal and S2 normal. No murmur heard. Pulmonary:      Effort: Pulmonary effort is normal. No respiratory distress.     Breath sounds: Examination of the right-upper field reveals wheezing and rhonchi. Examination of the left-upper field reveals wheezing and rhonchi. Wheezing (Minimal inspiratory wheezes in the upper lobes that are intermittent) and rhonchi (Mild coarseness in the upper lobes that is intermittent.) present. No decreased breath sounds or rales.     Comments: Oxygen  saturation on room air is 95-96%. Abdominal:     General: Bowel sounds are normal.     Palpations: Abdomen is soft.     Tenderness: There is no abdominal tenderness.  Musculoskeletal:        General: No swelling.     Cervical back: Neck supple.  Lymphadenopathy:     Head:     Right side of head: No submental, submandibular, tonsillar, preauricular or posterior auricular adenopathy.     Left side of head: No submental, submandibular, tonsillar, preauricular or posterior auricular adenopathy.     Cervical: No cervical adenopathy.     Right cervical: No superficial cervical adenopathy.    Left cervical: No superficial cervical adenopathy.  Skin:    General: Skin is warm and dry.     Capillary Refill: Capillary refill takes less than 2 seconds.     Findings: No rash.  Neurological:     Mental Status: She is alert and oriented to person, place, and time.  Psychiatric:        Mood and Affect: Mood normal.      UC Treatments / Results  Labs (all labs ordered are listed, but only abnormal results are displayed) Labs Reviewed  POC COVID19/FLU A&B COMBO - Normal    EKG   Radiology No results found.  Procedures Procedures (including critical care time)  Medications Ordered in UC Medications - No data to display  Initial Impression / Assessment and Plan / UC Course  I have reviewed the triage vital signs and the nursing notes.  Pertinent labs & imaging results that were available during my care of the patient were reviewed by me and considered in my  medical decision making (see chart for details).  Plan of Care: Acute viral bronchitis with cough: Rapid flu and COVID is negative.  Patient has some wheezing and minimal congestion in the upper lobes but otherwise breath sounds are clear to the bases.  Get plenty of fluids and rest.  Patient does not tolerate cough syrups or decongestants due to history of supraventricular tachycardia.  She does well with prednisone and tolerates inhalers.  Prednisone 20 mg daily for 5 days.  Patient has a Ventolin inhaler and will do 2 puffs every 4 hours if needed for wheezing.  We discussed possible chest x-ray but given the short duration of her symptoms, her breath sounds and her oxygen  saturation I felt like a chest x-ray was not needed and she agreed not to have one today.  She could return for reevaluation and get a chest x-ray then if needed.  If symptoms do not improve, worsen or new symptoms occur she will return here or go to primary care.  I reviewed the plan of care with the patient and/or the patient's guardian.  The patient and/or guardian had time to ask questions and acknowledged that the questions were answered.  I provided instruction on symptoms or reasons to return here or to go to an ER, if symptoms/condition did not improve, worsened or if new symptoms occurred.  Final Clinical Impressions(s) / UC Diagnoses   Final diagnoses:  Acute viral bronchitis  Acute cough     Discharge Instructions      Acute viral bronchitis with cough: Rapid flu and COVID is negative.  Patient has some wheezing and minimal congestion in the upper lobes but otherwise breath sounds are clear to the bases.  Get plenty of fluids and rest.  Patient does not tolerate cough syrups or decongestants due to history of supraventricular tachycardia.  She does well with prednisone and tolerates inhalers.  Prednisone 20 mg daily for 5 days.  Patient has a Ventolin inhaler and will do 2 puffs every 4 hours if needed for  wheezing.  We discussed possible chest x-ray but given the short duration of her symptoms, her breath sounds and her oxygen  saturation I felt like a chest x-ray was not needed and she agreed not to have one today.  She could return for reevaluation and get a chest x-ray then if needed.  If symptoms do not improve, worsen or new symptoms occur she will return here or go to primary care.     ED Prescriptions     Medication Sig Dispense Auth. Provider   predniSONE (DELTASONE) 20 MG tablet Take 1 tablet (20 mg total) by mouth daily with breakfast for 5 days. 5 tablet Walid Haig, FNP   benzonatate (TESSALON) 200 MG capsule Take 1 capsule (200 mg total) by mouth 3 (three) times daily as needed for cough. 20 capsule Ival Domino, FNP      PDMP not reviewed this encounter.   Ival Domino, FNP 12/25/23 [phone]

## 2023-12-25 NOTE — ED Triage Notes (Signed)
 Pt c/o coughing, headache, body aches, nasal congestion, sneezing, fever of 100.4. Pt has been around 2 people that had viral bronchitis.

## 2023-12-25 NOTE — Discharge Instructions (Addendum)
 Acute viral bronchitis with cough: Rapid flu and COVID is negative.  Patient has some wheezing and minimal congestion in the upper lobes but otherwise breath sounds are clear to the bases.  Get plenty of fluids and rest.  Patient does not tolerate cough syrups or decongestants due to history of supraventricular tachycardia.  She does well with prednisone and tolerates inhalers.  Prednisone 20 mg daily for 5 days.  Patient has a Ventolin inhaler and will do 2 puffs every 4 hours if needed for wheezing.  We discussed possible chest x-ray but given the short duration of her symptoms, her breath sounds and her oxygen  saturation I felt like a chest x-ray was not needed and she agreed not to have one today.  She could return for reevaluation and get a chest x-ray then if needed.  If symptoms do not improve, worsen or new symptoms occur she will return here or go to primary care.

## 2024-01-07 DIAGNOSIS — Z419 Encounter for procedure for purposes other than remedying health state, unspecified: Secondary | ICD-10-CM | POA: Diagnosis not present

## 2024-03-10 ENCOUNTER — Ambulatory Visit: Admitting: Cardiology
